# Patient Record
Sex: Female | Born: 1935 | Race: White | Hispanic: No | Smoking: Former smoker
Health system: Southern US, Community
[De-identification: ages and names within clinical notes are randomized; demographics above are authoritative.]

## PROBLEM LIST (undated history)

## (undated) DIAGNOSIS — D62 Acute posthemorrhagic anemia: Secondary | ICD-10-CM

## (undated) DIAGNOSIS — R0600 Dyspnea, unspecified: Secondary | ICD-10-CM

## (undated) DIAGNOSIS — Z923 Personal history of irradiation: Secondary | ICD-10-CM

## (undated) DIAGNOSIS — I5022 Chronic systolic (congestive) heart failure: Secondary | ICD-10-CM

## (undated) DIAGNOSIS — E7800 Pure hypercholesterolemia, unspecified: Secondary | ICD-10-CM

## (undated) DIAGNOSIS — R634 Abnormal weight loss: Secondary | ICD-10-CM

## (undated) DIAGNOSIS — M810 Age-related osteoporosis without current pathological fracture: Secondary | ICD-10-CM

## (undated) DIAGNOSIS — F419 Anxiety disorder, unspecified: Secondary | ICD-10-CM

## (undated) DIAGNOSIS — J449 Chronic obstructive pulmonary disease, unspecified: Secondary | ICD-10-CM

## (undated) DIAGNOSIS — I10 Essential (primary) hypertension: Secondary | ICD-10-CM

## (undated) DIAGNOSIS — K2970 Gastritis, unspecified, without bleeding: Secondary | ICD-10-CM

## (undated) DIAGNOSIS — E538 Deficiency of other specified B group vitamins: Secondary | ICD-10-CM

## (undated) DIAGNOSIS — C3490 Malignant neoplasm of unspecified part of unspecified bronchus or lung: Secondary | ICD-10-CM

## (undated) DIAGNOSIS — K429 Umbilical hernia without obstruction or gangrene: Secondary | ICD-10-CM

## (undated) DIAGNOSIS — I429 Cardiomyopathy, unspecified: Secondary | ICD-10-CM

## (undated) DIAGNOSIS — K254 Chronic or unspecified gastric ulcer with hemorrhage: Secondary | ICD-10-CM

## (undated) DIAGNOSIS — C801 Malignant (primary) neoplasm, unspecified: Secondary | ICD-10-CM

## (undated) HISTORY — DX: Anxiety disorder, unspecified: F41.9

## (undated) HISTORY — DX: Age-related osteoporosis without current pathological fracture: M81.0

## (undated) HISTORY — DX: Chronic or unspecified gastric ulcer with hemorrhage: K25.4

## (undated) HISTORY — DX: Personal history of irradiation: Z92.3

## (undated) HISTORY — DX: Acute posthemorrhagic anemia: D62

## (undated) HISTORY — DX: Gastritis, unspecified, without bleeding: K29.70

## (undated) HISTORY — DX: Pure hypercholesterolemia, unspecified: E78.00

## (undated) HISTORY — DX: Abnormal weight loss: R63.4

## (undated) HISTORY — PX: TUBAL LIGATION: SHX77

## (undated) HISTORY — DX: Deficiency of other specified B group vitamins: E53.8

## (undated) HISTORY — DX: Chronic obstructive pulmonary disease, unspecified: J44.9

## (undated) HISTORY — DX: Umbilical hernia without obstruction or gangrene: K42.9

## (undated) HISTORY — DX: Essential (primary) hypertension: I10

---

## 2011-08-26 ENCOUNTER — Encounter: Payer: Self-pay | Admitting: Internal Medicine

## 2011-08-27 ENCOUNTER — Ambulatory Visit (INDEPENDENT_AMBULATORY_CARE_PROVIDER_SITE_OTHER): Payer: Medicare HMO | Admitting: Internal Medicine

## 2011-08-27 ENCOUNTER — Ambulatory Visit (INDEPENDENT_AMBULATORY_CARE_PROVIDER_SITE_OTHER)
Admission: RE | Admit: 2011-08-27 | Discharge: 2011-08-27 | Disposition: A | Discharge status: Home / Self Care | Payer: Medicare HMO | Source: Ambulatory Visit | Attending: Internal Medicine | Admitting: Internal Medicine

## 2011-08-27 ENCOUNTER — Other Ambulatory Visit (INDEPENDENT_AMBULATORY_CARE_PROVIDER_SITE_OTHER): Payer: Medicare HMO

## 2011-08-27 ENCOUNTER — Encounter: Payer: Self-pay | Admitting: Internal Medicine

## 2011-08-27 VITALS — BP 148/82 | HR 84 | Temp 98.6°F | Ht <= 58 in | Wt 102.4 lb

## 2011-08-27 DIAGNOSIS — J449 Chronic obstructive pulmonary disease, unspecified: Secondary | ICD-10-CM

## 2011-08-27 DIAGNOSIS — R0600 Dyspnea, unspecified: Secondary | ICD-10-CM

## 2011-08-27 DIAGNOSIS — R0989 Other specified symptoms and signs involving the circulatory and respiratory systems: Secondary | ICD-10-CM

## 2011-08-27 DIAGNOSIS — I10 Essential (primary) hypertension: Secondary | ICD-10-CM

## 2011-08-27 DIAGNOSIS — F17200 Nicotine dependence, unspecified, uncomplicated: Secondary | ICD-10-CM

## 2011-08-27 DIAGNOSIS — R0609 Other forms of dyspnea: Secondary | ICD-10-CM

## 2011-08-27 DIAGNOSIS — D649 Anemia, unspecified: Secondary | ICD-10-CM

## 2011-08-27 LAB — BASIC METABOLIC PANEL
CO2: 27 mEq/L (ref 19–32)
Chloride: 102 mEq/L (ref 96–112)
Creatinine, Ser: 0.9 mg/dL (ref 0.4–1.2)
Potassium: 4.6 mEq/L (ref 3.5–5.1)

## 2011-08-27 LAB — CBC WITH DIFFERENTIAL/PLATELET
Basophils Relative: 0.6 % (ref 0.0–3.0)
Eosinophils Relative: 0.2 % (ref 0.0–5.0)
HCT: 27 % — ABNORMAL LOW (ref 36.0–46.0)
Hemoglobin: 8.6 g/dL — ABNORMAL LOW (ref 12.0–15.0)
Lymphs Abs: 2.3 10*3/uL (ref 0.7–4.0)
MCV: 81.9 fl (ref 78.0–100.0)
Monocytes Absolute: 0.6 10*3/uL (ref 0.1–1.0)
Monocytes Relative: 8.1 % (ref 3.0–12.0)
Neutro Abs: 4.9 10*3/uL (ref 1.4–7.7)
Platelets: 406 10*3/uL — ABNORMAL HIGH (ref 150.0–400.0)
WBC: 7.9 10*3/uL (ref 4.5–10.5)

## 2011-08-27 LAB — TSH: TSH: 0.96 u[IU]/mL (ref 0.35–5.50)

## 2011-08-27 MED ORDER — VALSARTAN 80 MG PO TABS: 80.0000 mg | ORAL_TABLET | Freq: Every day | ORAL | Status: DC

## 2011-08-27 NOTE — Patient Instructions (Addendum)
Stop prinivil zestoril lisinopril  Start valsartan 80 mg one daily   Stop spiriva and daliresp  Just use the duoneb (nebulizer) up to 4 times a day if needed  The key is to stop smoking completely before smoking completely stops you!   Please schedule a follow up office visit in 4 weeks, sooner if needed

## 2011-08-27 NOTE — Assessment & Plan Note (Signed)

## 2011-08-27 NOTE — Assessment & Plan Note (Signed)
I reviewed the Flethcher curve with patient that basically indicates  if you quit smoking when your best day FEV1 is still well preserved( which hers clearly as she can still go shopping on her good days) it is highly unlikely you will progress to severe disease and informed the patient there was no medication on the market that has proven to change the curve or the likelihood of progression.  Therefore stopping smoking and maintaining abstinence is the most important aspect of care, not choice of inhalers or for that matter, doctors.    For now rec duoneb prn, off acei and return for pfts

## 2011-08-27 NOTE — Assessment & Plan Note (Signed)

## 2011-08-27 NOTE — Assessment & Plan Note (Signed)
  Lab 08/27/11 1231  HGB 8.6 Repeated and verified X2.*    Normocytic with recent gi w/u neg in [place], should continue Fe for now and f/u with primary

## 2011-08-27 NOTE — Progress Notes (Signed)
  Subjective:    Patient ID: Alicia Davidson, female    DOB: 1930-10-19   MRN: [id number]  HPI  69 yowf active smoker referred 08/27/2011 to pulmonary clinic by Dr Arlyn Leak with episodic doe/cough x around 2000.   08/27/2011 1st pulmonary/ov Indalecio Malmstrom baseline  Feb 2013 not typically limited by breathing or needing daily resp rx, hosp in March in [place] for breathing problems then April GIB no source but since then doe x severe at rest at times and other times can still doing does shopping more fatigue than sob but better with inhalers which she has changed from every few days to daily multiple agents while on ACEI.   No unusual cough, purulent sputum or sinus/hb symptoms on present rx.     Sleeping ok without nocturnal  or early am exacerbation  of respiratory  c/o's or need for noct saba. Also denies any obvious fluctuation of symptoms with weather or environmental changes or other aggravating or alleviating factors except as outlined above     Review of Systems  Constitutional: Positive for unexpected weight change. Negative for fever and chills.  HENT: Negative for ear pain, nosebleeds, congestion, sore throat, rhinorrhea, sneezing, trouble swallowing, dental problem, voice change, postnasal drip and sinus pressure.   Eyes: Negative for visual disturbance.  Respiratory: Positive for cough and shortness of breath. Negative for choking.   Cardiovascular: Negative for chest pain and leg swelling.  Gastrointestinal: Negative for vomiting, abdominal pain and diarrhea.  Genitourinary: Negative for difficulty urinating.  Musculoskeletal: Negative for arthralgias.  Skin: Negative for rash.  Neurological: Negative for tremors, syncope and headaches.  Hematological: Does not bruise/bleed easily.       Objective:   Physical Exam Wt Readings from Last 3 Encounters:  08/27/11 102 lb 6.4 oz (46.448 kg)   amb frail wf >  Stated age HEENT mild turbinate edema.  Oropharynx no thrush or excess pnd or  cobblestoning.  No JVD or cervical adenopathy. Mild accessory muscle hypertrophy. Trachea midline, nl thryroid. Chest was hyperinflated by percussion with diminished breath sounds and moderate increased exp time without wheeze. Hoover sign positive at mid inspiration. Regular rate and rhythm without murmur gallop or rub or increase P2 or edema.  Abd: no hsm, nl excursion. Ext warm without cyanosis or clubbing.    CXR  08/27/2011 :  Chronic interstitial markings/emphysematous changes.  Mild patchy opacity at the right lung base, favored to reflect atelectasis        Assessment & Plan:

## 2011-08-28 NOTE — Progress Notes (Signed)
Quick Note:  Spoke with pt and notified of results per Dr. Wert. Pt verbalized understanding and denied any questions.  ______ 

## 2011-09-26 ENCOUNTER — Ambulatory Visit: Payer: Medicare HMO | Admitting: Internal Medicine

## 2011-10-09 ENCOUNTER — Encounter: Payer: Medicare HMO | Admitting: Internal Medicine

## 2011-10-09 NOTE — Progress Notes (Signed)
 This encounter was created in error - please disregard.

## 2011-10-14 ENCOUNTER — Encounter: Payer: Self-pay | Admitting: Internal Medicine

## 2011-10-14 ENCOUNTER — Ambulatory Visit (INDEPENDENT_AMBULATORY_CARE_PROVIDER_SITE_OTHER): Payer: Medicare HMO | Admitting: Internal Medicine

## 2011-10-14 VITALS — BP 140/60 | HR 76 | Temp 98.3°F | Ht 59.0 in | Wt 112.6 lb

## 2011-10-14 DIAGNOSIS — J449 Chronic obstructive pulmonary disease, unspecified: Secondary | ICD-10-CM

## 2011-10-14 DIAGNOSIS — F17200 Nicotine dependence, unspecified, uncomplicated: Secondary | ICD-10-CM

## 2011-10-14 DIAGNOSIS — I10 Essential (primary) hypertension: Secondary | ICD-10-CM

## 2011-10-14 NOTE — Assessment & Plan Note (Signed)
I reviewed the Flethcher curve with patient that basically indicates  if you quit smoking when your best day FEV1 is still well preserved (which hers is) it is highly unlikely you will progress to severe disease and informed the patient there was no medication on the market that has proven to change the curve or the likelihood of progression.  Therefore stopping smoking and maintaining abstinence is the most important aspect of care, not choice of inhalers or for that matter, doctors.   Pulmonary f/u can be prn

## 2011-10-14 NOTE — Assessment & Plan Note (Signed)
-   Trial off acei rec 08/27/2011 > symptoms resolved   - Spirometry 10/14/2011 FEV1  1.06 (69%) ratio 65  She does have GOLD II COPD but no symptoms or need for any form of inhalers/ nebs, at least in absence of exacerbation.  Therefor f/u can be prn

## 2011-10-14 NOTE — Progress Notes (Signed)
  Subjective:    Patient ID: Maryalice Pasley, female    DOB: April 27, 1930   MRN: [id number]  HPI  89 yowf active smoker referred 08/27/2011 to pulmonary clinic by Dr Arlyn Leak with episodic doe/cough x around 2000.   08/27/2011 1st pulmonary/ov Tashina Credit baseline  Feb 2013 not typically limited by breathing or needing daily resp rx, hosp in March in [place] for breathing problems then April GIB no source but since then doe x severe at rest at times and other times can still doing does shopping more fatigue than sob but better with inhalers which she has changed from every few days to daily multiple agents while on ACEI. rec Stop prinivil zestoril lisinopril Start valsartan 80 mg one daily  Stop spiriva and daliresp Just use the duoneb (nebulizer) up to 4 times a day if needed The key is to stop smoking completely before smoking completely stops you!      10/09/2011 f/u ov/Nicie Milan cc  Missed ov  10/14/2011 f/u ov/Coleby Yett cc no limiting sob, no need for hfa or neb at all, no cough, still smoking    Sleeping ok without nocturnal  or early am exacerbation  of respiratory  c/o's or need for noct saba. Also denies any obvious fluctuation of symptoms with weather or environmental changes or other aggravating or alleviating factors except as outlined above           Objective:   Physical Exam Wt 10/14/2011  112 Wt Readings from Last 3 Encounters:  08/27/11 102 lb 6.4 oz (46.448 kg)   amb frail wf >  Stated age HEENT mild turbinate edema.  Oropharynx no thrush or excess pnd or cobblestoning.  No JVD or cervical adenopathy. Mild accessory muscle hypertrophy. Trachea midline, nl thryroid. Chest was hyperinflated by percussion with diminished breath sounds and moderate increased exp time without wheeze. Hoover sign positive at mid inspiration. Regular rate and rhythm without murmur gallop or rub or increase P2 or edema.  Abd: no hsm, nl excursion. Ext warm without cyanosis or clubbing.             Assessment  & Plan:

## 2011-10-14 NOTE — Patient Instructions (Addendum)
You do not have significant copd but may be prone to attacks of breathing difficulty and cough as long as you keep smoking  You should avoid ace inhibitors as a class  Because they make you look worse than you really are   If you are satisfied with your treatment plan let your doctor know and he/she can either refill your medications or you can return here when your prescription runs out.     If in any way you are not 100% satisfied,  please tell us.  If 100% better, tell your friends!

## 2011-10-14 NOTE — Assessment & Plan Note (Signed)
-   Changed acei to arb 08/27/2011 due to ? Pseudoasthma > resolved  Adequate control on present rx, reviewed need to avoid acei to avoid confusion regarding interpretation of symptoms

## 2011-10-15 NOTE — Progress Notes (Signed)
Quick Note:  Spoke with pt and notified of results per Dr. Wert. Pt verbalized understanding and denied any questions.  ______ 

## 2011-12-10 ENCOUNTER — Ambulatory Visit: Payer: Medicare HMO | Admitting: Internal Medicine

## 2012-04-23 ENCOUNTER — Encounter: Payer: Self-pay | Admitting: Internal Medicine

## 2012-04-23 ENCOUNTER — Ambulatory Visit (INDEPENDENT_AMBULATORY_CARE_PROVIDER_SITE_OTHER): Payer: Medicare HMO | Admitting: Internal Medicine

## 2012-04-23 VITALS — BP 118/70 | HR 76 | Temp 97.3°F | Ht 59.0 in | Wt 131.0 lb

## 2012-04-23 DIAGNOSIS — J449 Chronic obstructive pulmonary disease, unspecified: Secondary | ICD-10-CM

## 2012-04-23 NOTE — Progress Notes (Signed)
Subjective:    Patient ID: Alicia Davidson, female    DOB: 10-Sep-1930   MRN: [id number]    Brief patient profile:  26 yowf  Quit smoking 01/07/12 referred 08/27/2011 to pulmonary clinic by Dr Arlyn Leak with episodic doe/cough x around 2000.  HPI 08/27/2011 1st pulmonary/ov Javeon Macmurray baseline  Feb 2013 not typically limited by breathing or needing daily resp rx, hosp in March in [place] for breathing problems then April GIB no source but since then doe x severe at rest at times and other times can still doing does shopping more fatigue than sob but better with inhalers which she has changed from every few days to daily multiple agents while on ACEI. rec Stop prinivil zestoril lisinopril Start valsartan 80 mg one daily  Stop spiriva and daliresp Just use the duoneb (nebulizer) up to 4 times a day if needed The key is to stop smoking completely before smoking completely stops you!      10/09/2011 f/u ov/Oumou Smead cc  Missed ov  10/14/2011 f/u ov/Wilson Dusenbery cc no limiting sob, no need for hfa or neb at all, no cough, still smoking rec You do not have significant copd but may be prone to attacks of breathing difficulty and cough as long as you keep smoking You should avoid ace inhibitors as a class  Because they make you look worse than you really are   04/23/2012 f/u ov/Annabeth Tortora off cigs as of 01/07/12 and overall much better since Chief Complaint  Patient presents with  . Follow-up    Pt was started on o2 about a month ago after hospital admit with CHF. She states has good and bad days with her breathing.    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 No unusual exp hx or h/o childhood pna/ asthma or premature birth to her knowledge.      Sleeping ok  without nocturnal  or early am exacerbation  of respiratory  c/o's or need for noct saba. Also denies any obvious fluctuation of symptoms with weather or environmental changes or other aggravating or alleviating factors except as outlined above   Current Medications, Allergies, Past Medical History, Past Surgical History, Family History, and Social History were reviewed in [employer] record.  ROS  The following are not active complaints unless bolded sore throat, dysphagia, dental problems, itching, sneezing,  nasal congestion or excess/ purulent secretions, ear ache,   fever, chills, sweats, unintended wt loss, pleuritic or exertional cp, hemoptysis,  orthopnea pnd or leg swelling, presyncope, palpitations, heartburn, abdominal pain, anorexia, nausea, vomiting, diarrhea  or change in bowel or urinary habits, change in stools or urine, dysuria,hematuria,  rash, arthralgias, visual complaints, headache, numbness weakness or ataxia or problems with walking or coordination,  change in mood/affect or memory.              Objective:   Physical Exam  Wt 10/14/2011  112  Wt Readings from Last 3 Encounters:  08/27/11 102 lb 6.4 oz (46.448 kg)   amb frail wf >  Stated age HEENT mild turbinate edema.  Oropharynx no thrush or excess pnd or cobblestoning.  No JVD or cervical adenopathy. Mild accessory muscle hypertrophy. Trachea midline, nl thryroid. Chest was hyperinflated by  percussion with diminished breath sounds and moderate increased exp time without wheeze. Hoover sign positive at mid inspiration. Regular rate and rhythm without murmur gallop or rub or increase P2 or edema.  Abd: no hsm, nl excursion. Ext warm without cyanosis or clubbing.                Assessment & Plan:

## 2012-04-23 NOTE — Patient Instructions (Addendum)
If your breathing gets worse first try xopenex and if not effective then nebulizer and call if you feel you need it because  Ideally you shouldn't need it more than a few times a week.  For now it would be best to stay on the oxygen while sleeping -  We can stop it if you want but would need overngiht 02 sats off oxygen - call Almyra Free [phone] after you see Cardiology

## 2012-04-25 NOTE — Assessment & Plan Note (Signed)
-   Trial off acei rec 08/27/2011 > symptoms resolved   - Spirometry 10/14/2011 FEV1  1.06 (69%) ratio 65  I had an extended summary  discussion with the patient today lasting 15 to 20 minutes of a 25 minute visit on the following issues:   Not clear she needs 02 or nebs either at this point. I reviewed the Flethcher curve with patient that basically indicates  if you quit smoking when your best day FEV1 is still well preserved (as hers is) it is highly unlikely you will progress to severe disease and informed the patient there was no medication on the market that has proven to change the curve or the likelihood of progression.  Therefore stopping smoking and maintaining abstinence is the most important aspect of care, not choice of inhalers or for that matter, doctors.      Each maintenance medication was reviewed in detail including most importantly the difference between maintenance and as needed and under what circumstances the prns are to be used.  Please see instructions for details which were reviewed in writing and the patient given a copy.    Pulmonary f/u can be prn

## 2012-05-10 ENCOUNTER — Encounter: Payer: Self-pay | Admitting: Cardiology

## 2012-05-11 ENCOUNTER — Encounter: Payer: Self-pay | Admitting: Cardiology

## 2012-05-11 ENCOUNTER — Ambulatory Visit (INDEPENDENT_AMBULATORY_CARE_PROVIDER_SITE_OTHER): Payer: Medicare HMO | Admitting: Cardiology

## 2012-05-11 VITALS — BP 138/73 | HR 74 | Ht 59.0 in | Wt 134.5 lb

## 2012-05-11 DIAGNOSIS — I5032 Chronic diastolic (congestive) heart failure: Secondary | ICD-10-CM

## 2012-05-11 DIAGNOSIS — I340 Nonrheumatic mitral (valve) insufficiency: Secondary | ICD-10-CM | POA: Insufficient documentation

## 2012-05-11 DIAGNOSIS — I10 Essential (primary) hypertension: Secondary | ICD-10-CM

## 2012-05-11 DIAGNOSIS — I5042 Chronic combined systolic (congestive) and diastolic (congestive) heart failure: Secondary | ICD-10-CM | POA: Insufficient documentation

## 2012-05-11 DIAGNOSIS — J449 Chronic obstructive pulmonary disease, unspecified: Secondary | ICD-10-CM

## 2012-05-11 DIAGNOSIS — I059 Rheumatic mitral valve disease, unspecified: Secondary | ICD-10-CM

## 2012-05-11 MED ORDER — FUROSEMIDE 20 MG PO TABS: 40.0000 mg | ORAL_TABLET | Freq: Every day | ORAL | Status: DC

## 2012-05-11 NOTE — Assessment & Plan Note (Signed)
Reviewed home blood pressure checks, systolics generally 120s to 140s most of the time. She is no longer on Cardizem which was stopped during her hospitalization in [place] back in March. May need to resume this versus potentially a cardioselective beta blocker if she does have LV systolic dysfunction as well.

## 2012-05-11 NOTE — Progress Notes (Signed)
Clinical Summary Alicia Davidson is a 79 y.o.female referred for cardiology consultation by [name] with [facility] of [place].  Record review finds hospitalization in [place] back in March of this year with shortness of breath, diagnosed with diastolic heart failure exacerbation. She was concurrently treated with steroids with history of COPD, taken off diltiazem related to low blood pressure, also diuresed with improvement. I do not see that an echocardiogram was performed at that time. There is mention in the H and P of an echocardiogram from April 2013 demonstrating LVEF 45-50% with diastolic dysfunction and increased LV filling pressure, moderate mitral regurgitation. I do not have this report.  Lab work from March revealed BUN 30, creatinine 1.0, potassium 4.3, sodium 139, BNP 601, hemoglobin 10.4, platelets 318, TSH 0.9, BNP 1900, LDL 75, HDL 76, cholesterol 162, triglycerides 53.  She is here with her daughter today. They brought in home blood pressure and heart rate checks as well as weights to review. [name] has been on Lasix at 40 mg in the morning since last seeing her primary care provider related to increased weight of approximately 5 pounds over baseline. She is not quite back to 130 pounds yet which is presumably her optimal weight. She does not report any chest pain, no palpitations, no recent leg or ankle edema. We discussed sodium and fluid restrictions. ECG today shows sinus rhythm with NSST changes.  We discussed the available records, recent lab work, general concept of diastolic heart failure, also mentioned her heart murmur on exam and reported mitral regurgitation by her previous echocardiogram. I am not entirely certain why she is on oxygen at this time, states that she was told to stay on it at night by [name]. I do not know if she has documented oxygen desaturation by oximetry at night. Oxygen therapy would not necessarily be required for treatment of her cardiac  status however.  No Known Allergies  Current Outpatient Prescriptions  Medication Sig Dispense Refill  . amoxicillin (AMOXIL) 875 MG tablet       . Ascorbic Acid (VITA-C PO) Take 1 tablet by mouth 2 (two) times daily.      Marland Kitchen atorvastatin (LIPITOR) 20 MG tablet Take 40 mg by mouth daily.       Marland Kitchen FOLIC ACID PO Take 100 mcg by mouth daily.      . furosemide (LASIX) 20 MG tablet Take 2 tablets (40 mg total) by mouth daily.  45 tablet  6  . ipratropium-albuterol (DUONEB) 0.5-2.5 (3) MG/3ML SOLN Take 3 mLs by nebulization every 6 (six) hours as needed.      . Iron TABS Take 1 tablet by mouth 2 (two) times daily.      Marland Kitchen levalbuterol (XOPENEX HFA) 45 MCG/ACT inhaler Inhale 2 puffs into the lungs every 6 (six) hours as needed.      Marland Kitchen LORazepam (ATIVAN) 0.5 MG tablet Take 0.5 mg by mouth daily as needed.      . Multiple Vitamins-Minerals (CENTRUM SILVER) tablet Take 1 tablet by mouth daily.      Marland Kitchen ofloxacin (FLOXIN) 0.3 % otic solution       . omeprazole (PRILOSEC) 40 MG capsule Take 40 mg by mouth daily.      . potassium chloride SA (K-DUR,KLOR-CON) 20 MEQ tablet Take 20 mEq by mouth daily.      . valsartan (DIOVAN) 160 MG tablet Take 160 mg by mouth daily.       No current facility-administered medications for this  visit.    Past Medical History  Diagnosis Date  . COPD (chronic obstructive pulmonary disease)   . Essential hypertension, benign   . Hypercholesteremia   . Osteoporosis   . B12 deficiency   . Anemia associated with acute blood loss   . Gastric ulcer with hemorrhage but without obstruction   . Weight loss, non-intentional   . Anxiety   . Umbilical hernia   . Gastritis     Mild  . Chronic diastolic heart failure     Diagnosed [place] March 2014    No past surgical history on file.  Family History  Problem Relation Age of Onset  . Heart disease Father   . Colon cancer Sister   . Kidney failure Brother   . Other Mother     Bright's disease    Social History Ms.  Halleck reports that she quit smoking about 4 months ago. Her smoking use included Cigarettes. She has a 16.8 pack-year smoking history. She has never used smokeless tobacco. Ms. Blow reports that she does not drink alcohol.  Review of Systems No cough, no recent wheezing. No hospitalizations since March. No orthopnea or PND. Otherwise negative.  Physical Examination Filed Vitals:   05/11/12 0838  BP: 138/73  Pulse: 74   Filed Weights   05/11/12 0838  Weight: 134 lb 8 oz (61.009 kg)   Elderly woman in no acute distress. HEENT: Conjunctiva and lids normal, oropharynx clear. Neck: Supple, no elevated JVP, no thyromegaly. Lungs: Diminished but clear to auscultation, nonlabored breathing at rest. Cardiac: Distant regular rate and rhythm, no S3, 2/6 apical systolic murmur, no pericardial rub. Abdomen: Soft, nontender, bowel sounds present, no guarding or rebound. Extremities: No pitting edema, distal pulses 1-2+. Skin: Warm and dry. Musculoskeletal: Kyphosis noted. Neuropsychiatric: Alert and oriented x3, affect grossly appropriate.   Problem List and Plan   Chronic diastolic heart failure Presumptive diagnosis based on available information. May be more accurately defined as combined heart failure, followup LVEF pending. She is not quite at her baseline weight, will increase Lasix to 60 mg in the morning for the next few days to a week with continued potassium supplements, follow weights until closer to 130 pounds. Then return Lasix to 40 mg in the morning. Follow BMET for clinical visit over the next few weeks. An echocardiogram is being arranged to define cardiac structure and function. We can proceed from there in terms of further medication adjustments.  Mitral regurgitation Murmur consistent with this on exam. Mention of echocardiogram from April of last year indicated moderate mitral regurgitation, report not available this time. Followup echocardiogram being obtained as  noted.  COPD GOLD II Followed by [name]. Oxygen therapy not clearly indicated for her cardiac status, may be more necessary from a pulmonary perspective if she desaturates, although not certain that she has had an oximetry evaluation as yet.  Essential hypertension, benign Reviewed home blood pressure checks, systolics generally 120s to 140s most of the time. She is no longer on Cardizem which was stopped during her hospitalization in [place] back in March. May need to resume this versus potentially a cardioselective beta blocker if she does have LV systolic dysfunction as well.    Jonelle Sidle, M.D., F.A.C.C.

## 2012-05-11 NOTE — Patient Instructions (Addendum)
Your physician recommends that you schedule a follow-up appointment in: 2-3 weeks  Your physician has recommended you make the following change in your medication:   1) INCREASE LASIX 20MG  TO THREE TIMES DAILY UNTIL WEIGHT REACHES BASELINE OF 130LBS ON HOME SCALES, RETURN TO TWO 20MG  TABLETS EVERY AM ONCE BASELINE WEIGHT IS REACHED  Your physician has requested that you have an echocardiogram. Echocardiography is a painless test that uses sound waves to create images of your heart. It provides your doctor with information about the size and shape of your heart and how well your heart's chambers and valves are working. This procedure takes approximately one hour. There are no restrictions for this procedure.  Your physician recommends that you return for lab work in: 2 WEEKS (BMET) SLIPS GIVEN

## 2012-05-11 NOTE — Assessment & Plan Note (Signed)
Presumptive diagnosis based on available information. May be more accurately defined as combined heart failure, followup LVEF pending. She is not quite at her baseline weight, will increase Lasix to 60 mg in the morning for the next few days to a week with continued potassium supplements, follow weights until closer to 130 pounds. Then return Lasix to 40 mg in the morning. Follow BMET for clinical visit over the next few weeks. An echocardiogram is being arranged to define cardiac structure and function. We can proceed from there in terms of further medication adjustments.

## 2012-05-11 NOTE — Assessment & Plan Note (Signed)
Followed by [name]. Oxygen therapy not clearly indicated for her cardiac status, may be more necessary from a pulmonary perspective if she desaturates, although not certain that she has had an oximetry evaluation as yet.

## 2012-05-11 NOTE — Assessment & Plan Note (Signed)
Murmur consistent with this on exam. Mention of echocardiogram from April of last year indicated moderate mitral regurgitation, report not available this time. Followup echocardiogram being obtained as noted.

## 2012-05-12 ENCOUNTER — Encounter: Payer: Self-pay | Admitting: Cardiology

## 2012-05-13 ENCOUNTER — Inpatient Hospital Stay (HOSPITAL_COMMUNITY): Admission: RE | Admit: 2012-05-13 | Payer: Medicare HMO | Source: Ambulatory Visit

## 2012-05-18 ENCOUNTER — Telehealth: Payer: Self-pay | Admitting: Cardiology

## 2012-05-18 NOTE — Telephone Encounter (Signed)
Noted  

## 2012-05-18 NOTE — Telephone Encounter (Signed)
FYI: tried to reach pt with more information times three, unable to do so but still wanted to advise

## 2012-05-18 NOTE — Telephone Encounter (Signed)
Pt lasix was increased and she has not lost any weight, but since starting she has been doing better. Just want to know why the weight is not coming down.

## 2012-05-19 NOTE — Telephone Encounter (Signed)
Spoke to pt and she noted that she has lost a pound over night, down from 133 to 132 and denies any SOB, Chest Pain, any sxs of concern, pt will continue current dosage and call back with any concerns or if her weight loss stops again and increases, pt understood all instructions, KL made aware verbally

## 2012-05-21 ENCOUNTER — Ambulatory Visit (HOSPITAL_COMMUNITY)
Admission: RE | Admit: 2012-05-21 | Discharge: 2012-05-21 | Disposition: A | Discharge status: Home / Self Care | Payer: Medicare HMO | Source: Ambulatory Visit | Attending: Cardiology | Admitting: Cardiology

## 2012-05-21 DIAGNOSIS — I059 Rheumatic mitral valve disease, unspecified: Secondary | ICD-10-CM

## 2012-05-21 DIAGNOSIS — J4489 Other specified chronic obstructive pulmonary disease: Secondary | ICD-10-CM | POA: Insufficient documentation

## 2012-05-21 DIAGNOSIS — I5032 Chronic diastolic (congestive) heart failure: Secondary | ICD-10-CM | POA: Insufficient documentation

## 2012-05-21 DIAGNOSIS — Z6827 Body mass index (BMI) 27.0-27.9, adult: Secondary | ICD-10-CM | POA: Insufficient documentation

## 2012-05-21 DIAGNOSIS — I10 Essential (primary) hypertension: Secondary | ICD-10-CM | POA: Insufficient documentation

## 2012-05-21 DIAGNOSIS — J449 Chronic obstructive pulmonary disease, unspecified: Secondary | ICD-10-CM | POA: Insufficient documentation

## 2012-05-21 DIAGNOSIS — F17200 Nicotine dependence, unspecified, uncomplicated: Secondary | ICD-10-CM | POA: Insufficient documentation

## 2012-05-21 NOTE — Progress Notes (Signed)
*  PRELIMINARY RESULTS* Echocardiogram 2D Echocardiogram has been performed.  [employer] 05/21/2012, 2:40 PM

## 2012-05-22 ENCOUNTER — Encounter: Payer: Self-pay | Admitting: *Deleted

## 2012-05-22 LAB — BASIC METABOLIC PANEL
BUN: 26 mg/dL — ABNORMAL HIGH (ref 6–23)
Glucose, Bld: 87 mg/dL (ref 70–99)
Potassium: 4.1 mEq/L (ref 3.5–5.3)

## 2012-06-05 ENCOUNTER — Ambulatory Visit: Payer: Medicare HMO | Admitting: Cardiology

## 2012-06-05 ENCOUNTER — Ambulatory Visit (INDEPENDENT_AMBULATORY_CARE_PROVIDER_SITE_OTHER): Payer: Medicare HMO | Admitting: Cardiology

## 2012-06-05 ENCOUNTER — Encounter: Payer: Self-pay | Admitting: Cardiology

## 2012-06-05 VITALS — BP 149/77 | HR 72 | Ht 59.0 in | Wt 133.2 lb

## 2012-06-05 DIAGNOSIS — I10 Essential (primary) hypertension: Secondary | ICD-10-CM

## 2012-06-05 DIAGNOSIS — J449 Chronic obstructive pulmonary disease, unspecified: Secondary | ICD-10-CM

## 2012-06-05 DIAGNOSIS — I340 Nonrheumatic mitral (valve) insufficiency: Secondary | ICD-10-CM

## 2012-06-05 DIAGNOSIS — I5042 Chronic combined systolic (congestive) and diastolic (congestive) heart failure: Secondary | ICD-10-CM

## 2012-06-05 DIAGNOSIS — I059 Rheumatic mitral valve disease, unspecified: Secondary | ICD-10-CM

## 2012-06-05 MED ORDER — CARVEDILOL 3.125 MG PO TABS: 3.1250 mg | ORAL_TABLET | Freq: Two times a day (BID) | ORAL | Status: DC

## 2012-06-05 NOTE — Patient Instructions (Addendum)
Your physician recommends that you schedule a follow-up appointment in: WITH SM TO BE DETERMINED BY DECISION OF TEST, CALL OFFICE TODAY [phone]  Your physician has recommended you make the following change in your medication:   1) START ASPIRIN 81MG  ONCE DAILY 2) START COREG 3.125MG  TWICE DAILY

## 2012-06-05 NOTE — Assessment & Plan Note (Signed)
Moderate by recent echocardiogram was moderately dilated left atrium.

## 2012-06-05 NOTE — Assessment & Plan Note (Signed)
Medications be adjusted as above.

## 2012-06-05 NOTE — Progress Notes (Signed)
Clinical Summary Alicia Davidson is a 79 y.o.female seen in the office for the first time in early May. She is here with her son-in-law to go over recent cardiac evaluation. Symptomatically, she reports continued episodes of intermittent breathlessness, not always with exertion. No definite chest tightness. She states her weight has been stable.  Recent followup echocardiogram on 5/15 demonstrated mildly to moderately dilated left ventricle with LVEF 30-35%, diffuse hypokinesis, calcified aortic annulus was mildly reduced cusp excursion, moderate mitral regurgitation, moderately dilated left atrium, mildly to moderately dilated right ventricle with mild right atrial enlargement, bowing of the atrial septum from left to right, mild to moderate tricuspid regurgitation, PASP 57 mm mercury. Review the results with him today.  Patient has evidence of cardiomyopathy, not entirely clear that this is ischemic with global hypokinesis, although underlying CAD remains a possibility. Duration of systolic dysfunction is not clear, as no structural assessment was made during her most recent evaluation in [place]. She has hypertension and hyperlipidemia, increasing her risk for ischemic heart disease.  We discussed test findings and implications, possible avenues for evaluating ischemic heart disease further including noninvasive and invasive techniques. I recommended that the patient, her daughter, and son-in-law discussed these options, and they plan to call back this afternoon. We also reviewed medication adjustments.  No Known Allergies  Current Outpatient Prescriptions  Medication Sig Dispense Refill  . Ascorbic Acid (VITA-C PO) Take 1 tablet by mouth 2 (two) times daily.      Marland Kitchen atorvastatin (LIPITOR) 20 MG tablet Take 40 mg by mouth daily.       Marland Kitchen FOLIC ACID PO Take 100 mcg by mouth daily.      . furosemide (LASIX) 20 MG tablet Take 2 tablets (40 mg total) by mouth daily.  45 tablet  6  .  ipratropium-albuterol (DUONEB) 0.5-2.5 (3) MG/3ML SOLN Take 3 mLs by nebulization every 6 (six) hours as needed.      . Iron TABS Take 1 tablet by mouth 2 (two) times daily.      Marland Kitchen levalbuterol (XOPENEX HFA) 45 MCG/ACT inhaler Inhale 2 puffs into the lungs every 6 (six) hours as needed.      Marland Kitchen LORazepam (ATIVAN) 0.5 MG tablet Take 0.5 mg by mouth daily as needed.      . Multiple Vitamins-Minerals (CENTRUM SILVER) tablet Take 1 tablet by mouth daily.      Marland Kitchen omeprazole (PRILOSEC) 40 MG capsule Take 40 mg by mouth daily.      . potassium chloride SA (K-DUR,KLOR-CON) 20 MEQ tablet Take 20 mEq by mouth daily.      . valsartan (DIOVAN) 160 MG tablet Take 160 mg by mouth daily.      . carvedilol (COREG) 3.125 MG tablet Take 1 tablet (3.125 mg total) by mouth 2 (two) times daily.  180 tablet  3   No current facility-administered medications for this visit.    Past Medical History  Diagnosis Date  . COPD (chronic obstructive pulmonary disease)   . Essential hypertension, benign   . Hypercholesteremia   . Osteoporosis   . B12 deficiency   . Anemia associated with acute blood loss   . Gastric ulcer with hemorrhage but without obstruction   . Weight loss, non-intentional   . Anxiety   . Umbilical hernia   . Gastritis     Mild  . Chronic diastolic heart failure     Diagnosed [place] March 2014    Social History Alicia Davidson reports that she quit smoking about  4 months ago. Her smoking use included Cigarettes. She has a 16.8 pack-year smoking history. She has never used smokeless tobacco. Alicia Davidson reports that she does not drink alcohol.  Review of Systems Negative except as outlined above.  Physical Examination Filed Vitals:   06/05/12 0858  BP: 149/77  Pulse: 72   Filed Weights   06/05/12 0858  Weight: 133 lb 4 oz (60.442 kg)    Elderly woman in no acute distress.  HEENT: Conjunctiva and lids normal, oropharynx clear.  Neck: Supple, no elevated JVP, no thyromegaly.  Lungs:  Diminished but clear to auscultation, nonlabored breathing at rest.  Cardiac: Distant regular rate and rhythm, no S3, 2/6 apical systolic murmur, no pericardial rub.  Abdomen: Soft, nontender, bowel sounds present, no guarding or rebound.  Extremities: No pitting edema, distal pulses 1-2+.  Skin: Warm and dry.  Musculoskeletal: Kyphosis noted.  Neuropsychiatric: Alert and oriented x3, affect grossly appropriate.   Problem List and Plan   Chronic combined systolic and diastolic heart failure Recent echocardiogram reviewed with the patient and her son-in-law. LVEF is actually in the range of 30-35% with diffuse hypokinesis, also elevated right-sided pressures and mitral regurgitation. To her current medical regimen we will add aspirin 81 mg daily and begin Coreg 3.125 mg twice daily. I recommended that we consider further ischemic evaluation, discussed rationale as well as risks and benefits of both noninvasive and invasive techniques. Family will discuss the matter and call us back today. If noninvasive testing is decided on as a first step, we will pursue a [employer].  Essential hypertension, benign Medications be adjusted as above.  Mitral regurgitation Moderate by recent echocardiogram was moderately dilated left atrium.  COPD GOLD II Followed by [name].    Jonelle Sidle, M.D., F.A.C.C.

## 2012-06-05 NOTE — Assessment & Plan Note (Signed)
Recent echocardiogram reviewed with the patient and her son-in-law. LVEF is actually in the range of 30-35% with diffuse hypokinesis, also elevated right-sided pressures and mitral regurgitation. To her current medical regimen we will add aspirin 81 mg daily and begin Coreg 3.125 mg twice daily. I recommended that we consider further ischemic evaluation, discussed rationale as well as risks and benefits of both noninvasive and invasive techniques. Family will discuss the matter and call us back today. If noninvasive testing is decided on as a first step, we will pursue a [employer].

## 2012-06-05 NOTE — Assessment & Plan Note (Signed)
Followed by Dr Wert 

## 2012-06-10 ENCOUNTER — Telehealth: Payer: Self-pay | Admitting: Cardiology

## 2012-06-10 NOTE — Telephone Encounter (Signed)
Please call regarding appointment for stress test. / tgs

## 2012-06-12 NOTE — Telephone Encounter (Signed)
.  left message to have patient return my call. Per pt was to decide to have either:  lexiscan (on medications) post test with SM ONLY per cardiomyopathy Or cath, if cath we will call SM to discuss further

## 2012-06-15 NOTE — Telephone Encounter (Signed)
Pt scheduled to see KL tomorrow per SOB

## 2012-06-16 ENCOUNTER — Encounter: Payer: Self-pay | Admitting: *Deleted

## 2012-06-16 ENCOUNTER — Ambulatory Visit (INDEPENDENT_AMBULATORY_CARE_PROVIDER_SITE_OTHER): Payer: Medicare HMO | Admitting: Adult Health

## 2012-06-16 ENCOUNTER — Encounter: Payer: Self-pay | Admitting: Adult Health

## 2012-06-16 VITALS — BP 118/60 | HR 72 | Resp 17 | Wt 133.0 lb

## 2012-06-16 DIAGNOSIS — I10 Essential (primary) hypertension: Secondary | ICD-10-CM

## 2012-06-16 DIAGNOSIS — I059 Rheumatic mitral valve disease, unspecified: Secondary | ICD-10-CM

## 2012-06-16 DIAGNOSIS — I5042 Chronic combined systolic (congestive) and diastolic (congestive) heart failure: Secondary | ICD-10-CM

## 2012-06-16 DIAGNOSIS — I340 Nonrheumatic mitral (valve) insufficiency: Secondary | ICD-10-CM

## 2012-06-16 NOTE — Assessment & Plan Note (Addendum)
Per echocardiogram the patient was found to have moderate mitral regurg, with a moderately dilated left atrium and mildly to markedly dilated right ventricle. Continue heart rate and blood pressure control.

## 2012-06-16 NOTE — Progress Notes (Signed)
HPI: [name] is a 79 year old of Dr. Diona Browner whereupon for ongoing assessment and management of combined congestive heart failure, most recent EF on 05/21/2012 demonstrated 30-35%, diffuse hypokinesis, with calcified aortic annulus and mildly reduced cusp excursion. On last visit with Dr. Diona Browner on 06/05/2012 possibility of ischemic heart disease was discussed with the patient and her son and daughter. It was not entirely clear that it was ischemic in the setting of global hypokinesis although underlying CAD remained a possibility. The patient was started on aspirin 81 mg daily, Coreg 3.125 mg daily. A stress Myoview was recommended. This was left to the decision of the patient and her family to pursue.   She comes today with her daughter and after long discussion has decided that she will pursue a [employer]. She is been seen by Dr. work in the interim and has been advised not to take Coreg in the setting of her COPD. She does state that her breathing status has worsened some but there is no active wheezing. She uses oxygen at night and when necessary during the day. She denies palpitations or near syncope.  No Known Allergies  Current Outpatient Prescriptions  Medication Sig Dispense Refill  . Ascorbic Acid (VITA-C PO) Take 1 tablet by mouth 2 (two) times daily.      Marland Kitchen atorvastatin (LIPITOR) 20 MG tablet Take 40 mg by mouth daily.       . carvedilol (COREG) 3.125 MG tablet Take 1 tablet (3.125 mg total) by mouth 2 (two) times daily.  180 tablet  3  . FOLIC ACID PO Take 100 mcg by mouth daily.      . furosemide (LASIX) 20 MG tablet Take 2 tablets (40 mg total) by mouth daily.  45 tablet  6  . ipratropium-albuterol (DUONEB) 0.5-2.5 (3) MG/3ML SOLN Take 3 mLs by nebulization every 6 (six) hours as needed.      . Iron TABS Take 1 tablet by mouth 2 (two) times daily.      Marland Kitchen levalbuterol (XOPENEX HFA) 45 MCG/ACT inhaler Inhale 2 puffs into the lungs every 6 (six) hours as needed.      Marland Kitchen  LORazepam (ATIVAN) 0.5 MG tablet Take 0.5 mg by mouth daily as needed.      . Multiple Vitamins-Minerals (CENTRUM SILVER) tablet Take 1 tablet by mouth daily.      Marland Kitchen omeprazole (PRILOSEC) 40 MG capsule Take 40 mg by mouth daily.      . potassium chloride SA (K-DUR,KLOR-CON) 20 MEQ tablet Take 20 mEq by mouth daily.      . valsartan (DIOVAN) 160 MG tablet Take 160 mg by mouth daily.       No current facility-administered medications for this visit.    Past Medical History  Diagnosis Date  . COPD (chronic obstructive pulmonary disease)   . Essential hypertension, benign   . Hypercholesteremia   . Osteoporosis   . B12 deficiency   . Anemia associated with acute blood loss   . Gastric ulcer with hemorrhage but without obstruction   . Weight loss, non-intentional   . Anxiety   . Umbilical hernia   . Gastritis     Mild  . Chronic diastolic heart failure     Diagnosed [place] March 2014    No past surgical history on file.  ZOX:WRUEAV of systems complete and found to be negative unless listed above  PHYSICAL EXAM BP 118/60  Pulse 72  Resp 17  Wt 133 lb (60.328 kg)  BMI 26.85  kg/m2  General: Well developed, well nourished, in no acute distress Head: Eyes PERRLA, No xanthomas.   Normal cephalic and atramatic  Lungs: Clear bilaterally to auscultation and percussion. Heart: HRRR S1 S2, distant heart sounds with 1/6 systolic murmur at the apex,pulses are 2+ & equal.            No carotid bruit. No JVD.  No abdominal bruits. No femoral bruits. Abdomen: Bowel sounds are positive, abdomen soft and non-tender without masses or                  Hernia's noted. Msk:  Back kyphosis is noted, slow gait. Normal strength and tone for age. Extremities: Mild clubbing, no cyanosis or edema.  DP +1 Neuro: Alert and oriented X 3. Psych:  Good affect, responds appropriately    ASSESSMENT AND PLAN

## 2012-06-16 NOTE — Assessment & Plan Note (Signed)
Blood pressure is currently well controlled. Will not make any changes at this time. She will remain on Lasix prolactin and valsartan. Followup labs in one month will be obtained to ascertain kidney function.

## 2012-06-16 NOTE — Assessment & Plan Note (Signed)
There is no evidence of decompensation at this time. Her weight has remained stable. She is denying any PND, but she does have some chronic dyspnea on exertion. Most likely related to COPD. Due to echocardiogram demonstrating EF of 3035% with diffuse hypokinesis the family has made the decision to proceed with a Lexiscan Myoview. This will be completed within a week. I explained to them that if Myoview demonstrates ischemia in the next step will be cardiac catheterization. They are willing to proceed with this should be necessary. I will not make any changes in her medication regimen as she is tolerating the Coreg without wheezing. Blood pressure parameters are given to her daughter to keep her blood pressure less than 120 systolic. She is to call for any new symptoms prior to stress test. She is advised to bring inhalers to her stress test.

## 2012-06-16 NOTE — Patient Instructions (Addendum)
Your physician recommends that you schedule a follow-up appointment in: ONE WEEK  Your physician recommends that you continue on your current medications as directed. Please refer to the Current Medication list given to you today.  Your physician has requested that you have a lexiscan myoview. For further information please visit [email or web address] Please follow instruction sheet, as given.

## 2012-06-16 NOTE — Telephone Encounter (Signed)
Pt scheduled for lexiscan per KL at [facility] today

## 2012-06-23 ENCOUNTER — Encounter (HOSPITAL_COMMUNITY)
Admission: RE | Admit: 2012-06-23 | Discharge: 2012-06-23 | Disposition: A | Discharge status: Home / Self Care | Payer: Medicare HMO | Source: Ambulatory Visit | Attending: Adult Health | Admitting: Adult Health

## 2012-06-23 ENCOUNTER — Encounter (HOSPITAL_COMMUNITY): Payer: Self-pay

## 2012-06-23 DIAGNOSIS — I5042 Chronic combined systolic (congestive) and diastolic (congestive) heart failure: Secondary | ICD-10-CM

## 2012-06-23 DIAGNOSIS — I10 Essential (primary) hypertension: Secondary | ICD-10-CM | POA: Insufficient documentation

## 2012-06-23 DIAGNOSIS — I509 Heart failure, unspecified: Secondary | ICD-10-CM

## 2012-06-23 DIAGNOSIS — R0600 Dyspnea, unspecified: Secondary | ICD-10-CM

## 2012-06-23 HISTORY — DX: Malignant (primary) neoplasm, unspecified: C80.1

## 2012-06-23 MED ORDER — SODIUM CHLORIDE 0.9 % IJ SOLN
INTRAMUSCULAR | Status: AC
  Administered 2012-06-23: 10 mL via INTRAVENOUS
  Filled 2012-06-23: qty 10

## 2012-06-23 MED ORDER — REGADENOSON 0.4 MG/5ML IV SOLN
INTRAVENOUS | Status: AC
  Administered 2012-06-23: 0.4 mg via INTRAVENOUS
  Filled 2012-06-23: qty 5

## 2012-06-23 MED ORDER — TECHNETIUM TC 99M SESTAMIBI - CARDIOLITE
30.0000 | Freq: Once | INTRAVENOUS | Status: AC | PRN
  Administered 2012-06-23: 30 via INTRAVENOUS

## 2012-06-23 MED ORDER — TECHNETIUM TC 99M SESTAMIBI - CARDIOLITE
10.0000 | Freq: Once | INTRAVENOUS | Status: AC | PRN
  Administered 2012-06-23: 10:00:00 10 via INTRAVENOUS

## 2012-06-23 NOTE — Progress Notes (Signed)
Stress Lab Nurses Notes - Alicia Davidson 06/23/2012 Reason for doing test: CHF & SOB Type of test: Marlane Hatcher Nurse performing test: Parke Poisson, RN Nuclear Medicine Tech: Lyndel Pleasure Echo Tech: Not Applicable MD performing test: R. Rothbart Family MD: McInnis Test explained and consent signed: yes IV started: 22g jelco, NS 250 cc, No redness or edema and Saline lock started in radiology Symptoms: Mild SOB & Dizziness Treatment/Intervention: 250cc NS given during recovery BP 50/39 & Dizziness Reason test stopped: protocol completed After recovery IV was: Discontinued via X-ray tech and No redness or edema Patient to return to Nuc. Med at : 13:00 Patient discharged: Home Patient's Condition upon discharge was: stable Comments: During test BP 50/39, HR 76  & Dizziness. IV Bolus given 250 cc NS . BP return to 117/77 in recovery & HR 73.  Symptoms resolved in recovery. [name] T

## 2012-06-30 ENCOUNTER — Encounter: Payer: Self-pay | Admitting: Adult Health

## 2012-06-30 ENCOUNTER — Ambulatory Visit (INDEPENDENT_AMBULATORY_CARE_PROVIDER_SITE_OTHER): Payer: Medicare HMO | Admitting: Adult Health

## 2012-06-30 VITALS — BP 134/86 | HR 92 | Ht 59.0 in | Wt 136.1 lb

## 2012-06-30 DIAGNOSIS — J449 Chronic obstructive pulmonary disease, unspecified: Secondary | ICD-10-CM

## 2012-06-30 DIAGNOSIS — I5042 Chronic combined systolic (congestive) and diastolic (congestive) heart failure: Secondary | ICD-10-CM

## 2012-06-30 DIAGNOSIS — J4489 Other specified chronic obstructive pulmonary disease: Secondary | ICD-10-CM

## 2012-06-30 DIAGNOSIS — R0600 Dyspnea, unspecified: Secondary | ICD-10-CM

## 2012-06-30 DIAGNOSIS — R0989 Other specified symptoms and signs involving the circulatory and respiratory systems: Secondary | ICD-10-CM

## 2012-06-30 DIAGNOSIS — I10 Essential (primary) hypertension: Secondary | ICD-10-CM

## 2012-06-30 DIAGNOSIS — J390 Retropharyngeal and parapharyngeal abscess: Secondary | ICD-10-CM

## 2012-06-30 MED ORDER — DIGOXIN 125 MCG PO TABS
0.1250 mg | ORAL_TABLET | Freq: Every day | ORAL | Status: DC
Start: 2012-06-30 — End: 2012-10-20

## 2012-06-30 MED ORDER — CARVEDILOL 6.25 MG PO TABS: 6.2500 mg | ORAL_TABLET | Freq: Two times a day (BID) | ORAL | Status: DC

## 2012-06-30 NOTE — Patient Instructions (Addendum)
Your physician recommends that you schedule a follow-up appointment in: 1 MONTH  Your physician has recommended you make the following change in your medication: 1. Coreg 6.25mg   Twice a day. 2. Digoxin 0.125 mg daily  Your physician recommends that you return for lab work in 1 WEEK (BMET, Dig level)

## 2012-06-30 NOTE — Progress Notes (Signed)
HPI: Alicia Davidson is a 79 year old patient of Dr. Diona Browner were following for ongoing assessment and management of combined congestive heart failure, with most recent echocardiogram demonstrating an EF of 30-35% in May of 2014. He was also found to have a calcified aortic annulus and mildly reduced cusps excursion. She was scheduled for [facility] on last visit.    This  revealed:  Abnormal pharmacologic stress nuclear myocardial study revealing substantial left ventricular enlargement, moderate to severe global left ventricular dysfunction and normal myocardial perfusion. The stress EKG was nondiagnostic due to baseline ST-segment abnormalities.   She comes today without any new symptoms. Continues to have some dyspnea on exertion. She is on oxygen at home when necessary.   No Known Allergies  Current Outpatient Prescriptions  Medication Sig Dispense Refill  . Ascorbic Acid (VITA-C PO) Take 1 tablet by mouth 2 (two) times daily.      Marland Kitchen atorvastatin (LIPITOR) 20 MG tablet Take 40 mg by mouth daily.       . carvedilol (COREG) 6.25 MG tablet Take 1 tablet (6.25 mg total) by mouth 2 (two) times daily.  180 tablet  3  . FOLIC ACID PO Take 100 mcg by mouth daily.      . furosemide (LASIX) 20 MG tablet Take 2 tablets (40 mg total) by mouth daily.  45 tablet  6  . ipratropium-albuterol (DUONEB) 0.5-2.5 (3) MG/3ML SOLN Take 3 mLs by nebulization every 6 (six) hours as needed.      . Iron TABS Take 1 tablet by mouth 2 (two) times daily.      Marland Kitchen levalbuterol (XOPENEX HFA) 45 MCG/ACT inhaler Inhale 2 puffs into the lungs every 6 (six) hours as needed.      Marland Kitchen LORazepam (ATIVAN) 0.5 MG tablet Take 0.5 mg by mouth daily as needed.      . Multiple Vitamins-Minerals (CENTRUM SILVER) tablet Take 1 tablet by mouth daily.      Marland Kitchen omeprazole (PRILOSEC) 40 MG capsule Take 40 mg by mouth daily.      . potassium chloride SA (K-DUR,KLOR-CON) 20 MEQ tablet Take 20 mEq by mouth daily.      . valsartan  (DIOVAN) 160 MG tablet Take 160 mg by mouth daily.      . digoxin (LANOXIN) 0.125 MG tablet Take 1 tablet (0.125 mg total) by mouth daily.  90 tablet  3   No current facility-administered medications for this visit.    Past Medical History  Diagnosis Date  . COPD (chronic obstructive pulmonary disease)   . Essential hypertension, benign   . Hypercholesteremia   . Osteoporosis   . B12 deficiency   . Anemia associated with acute blood loss   . Gastric ulcer with hemorrhage but without obstruction   . Weight loss, non-intentional   . Anxiety   . Umbilical hernia   . Gastritis     Mild  . Chronic diastolic heart failure     Diagnosed [place] March 2014  . Cancer   . CHF (congestive heart failure)     History reviewed. No pertinent past surgical history.  ZOX:WRUEAV sinus rhythm  PHYSICAL EXAM BP 134/86  Pulse 92  Ht 4\' 11"  (1.499 m)  Wt 136 lb 1.9 oz (61.744 kg)  BMI 27.48 kg/m2  SpO2 98%  General: Well developed, well nourished, in no acute distress Head: Eyes PERRLA, No xanthomas.   Normal cephalic and atramatic  Lungs: Clear bilaterally to auscultation with mild wheezes noted in the bases. Heart: HRRR  S1 S2, distant heart sounds without MRG.  Pulses are 2+ & equal.            No carotid bruit. No JVD.  No abdominal bruits. No femoral bruits. Abdomen: Bowel sounds are positive, abdomen soft and non-tender without masses or                  Hernia's noted. Msk:  Back normal, normal gait. Normal strength and tone for age. Extremities: No clubbing, cyanosis or edema.  DP +1 Neuro: Alert and oriented X 3. Psych:  Good affect, responds appropriately    ASSESSMENT AND PLAN

## 2012-06-30 NOTE — Assessment & Plan Note (Signed)
Lexiscan Myoview is reassuring for evidence of ischemia, and therefore cardiac catheterization will not be planned. Due to significant LV systolic dysfunction, I will optimize her medications. I will increase her core 6.25 mg twice a day and begin digoxin 0.125 mg daily. She will continue Lasix and ARB. Reluctant to begin spironolactone at this time. We will start out slow with medication adjustments and additions. She will followup with the BMET next week, and will be seen again in one month. Would like to see her on a monthly basis for a short period of time to evaluate her symptoms and progress. We will repeat echocardiogram in approximately 3 months to evaluate LV function on medication optimization.

## 2012-06-30 NOTE — Assessment & Plan Note (Signed)
Blood pressure is only moderately controlled for this patient with significant systolic dysfunction. With titration up of Coreg will follow closely concerning her blood pressure and heart rate. Heart rate today was in the high 90s. I spent a considerable amount of time talking with the patient and her daughter concerning her cardiomyopathy and treatment regimen ( 30 minutes). We will have close followup with labs.

## 2012-06-30 NOTE — Assessment & Plan Note (Signed)
She will continue to follow with [name] for ongoing assessment and management.

## 2012-06-30 NOTE — Assessment & Plan Note (Signed)
This may be related to cardiomyopathy in conjunction with COPD. She is instructed to use Xopenex half before using nebulizer treatments to avoid increasing heart rate. She is also advised to wear oxygen at night as she has been prescribed to do so, and also when oxygen consumption has declined with an saturation of 88%.

## 2012-07-07 LAB — BASIC METABOLIC PANEL
Chloride: 101 mEq/L (ref 96–112)
Creat: 1.05 mg/dL (ref 0.50–1.10)
Potassium: 4.9 mEq/L (ref 3.5–5.3)

## 2012-07-08 LAB — DIGOXIN LEVEL: Digoxin Level: 1.6 ng/mL (ref 0.8–2.0)

## 2012-07-09 ENCOUNTER — Encounter: Payer: Self-pay | Admitting: *Deleted

## 2012-08-03 ENCOUNTER — Ambulatory Visit (INDEPENDENT_AMBULATORY_CARE_PROVIDER_SITE_OTHER): Payer: Medicare HMO | Admitting: Adult Health

## 2012-08-03 ENCOUNTER — Encounter: Payer: Self-pay | Admitting: Adult Health

## 2012-08-03 VITALS — BP 134/62 | HR 61 | Ht 59.0 in | Wt 138.0 lb

## 2012-08-03 DIAGNOSIS — R0600 Dyspnea, unspecified: Secondary | ICD-10-CM

## 2012-08-03 DIAGNOSIS — I10 Essential (primary) hypertension: Secondary | ICD-10-CM

## 2012-08-03 DIAGNOSIS — R0989 Other specified symptoms and signs involving the circulatory and respiratory systems: Secondary | ICD-10-CM

## 2012-08-03 DIAGNOSIS — I5042 Chronic combined systolic (congestive) and diastolic (congestive) heart failure: Secondary | ICD-10-CM

## 2012-08-03 NOTE — Assessment & Plan Note (Addendum)
She is doing well. She has some positional changes of dizziness. Orthostatics are negative with BP rising with standing 234 systolic. Heart rate remains 57-60.  Will continue current medications without changes. I discussed with her daughter to continue to monitor her her vital signs on a daily basis. She is report any symptoms. I discussed My Chart with her and advised her to send me an e-mail for any questions she may have. We will followup with an echocardiogram in 3 months, and a BMET.

## 2012-08-03 NOTE — Assessment & Plan Note (Signed)
This is related to chronic COPD. There is no wheezes or productive coughing on exam. No changes in her medicines.

## 2012-08-03 NOTE — Assessment & Plan Note (Signed)
Blood pressure is well-controlled. Continue current dose of Coreg and Diovan.

## 2012-08-03 NOTE — Progress Notes (Signed)
HPI: Alicia Davidson is a  79 y/o patient of Dr. Diona Browner we are seeing for close follow up for combined CHF, with EF of 30-35%. On last visit she was started on low dose digoxin 0.125 mg and increased dose of coreg to 6.25 mg BID. She has some complaints of dizziness with position change, but no frank syncope.     Labs were completed on last visit demonstrating Na+ of 139, K+ 4.9, Creatinine 1.05. She is otherwise without complaint. her daughter who is with her, brings a list of her blood pressure recordings. They have been running in the 120s to 130s systolic with heart rates between 80 and 90 beats per minute. He denies any chest pain, shortness of breath, vision changes, or nausea with medication changes.   No Known Allergies  Current Outpatient Prescriptions  Medication Sig Dispense Refill  . Ascorbic Acid (VITA-C PO) Take 1 tablet by mouth 2 (two) times daily.      Marland Kitchen aspirin 81 MG tablet Take 81 mg by mouth daily.      Marland Kitchen atorvastatin (LIPITOR) 20 MG tablet Take 40 mg by mouth daily.       . carvedilol (COREG) 6.25 MG tablet Take 1 tablet (6.25 mg total) by mouth 2 (two) times daily.  180 tablet  3  . digoxin (LANOXIN) 0.125 MG tablet Take 1 tablet (0.125 mg total) by mouth daily.  90 tablet  3  . FOLIC ACID PO Take 100 mcg by mouth daily.      . furosemide (LASIX) 20 MG tablet Take 2 tablets (40 mg total) by mouth daily.  45 tablet  6  . ipratropium-albuterol (DUONEB) 0.5-2.5 (3) MG/3ML SOLN Take 3 mLs by nebulization every 6 (six) hours as needed.      . Iron TABS Take 1 tablet by mouth 2 (two) times daily.      Marland Kitchen levalbuterol (XOPENEX HFA) 45 MCG/ACT inhaler Inhale 2 puffs into the lungs every 6 (six) hours as needed.      Marland Kitchen LORazepam (ATIVAN) 0.5 MG tablet Take 0.5 mg by mouth daily as needed.      . Multiple Vitamins-Minerals (CENTRUM SILVER) tablet Take 1 tablet by mouth daily.      Marland Kitchen omeprazole (PRILOSEC) 40 MG capsule Take 40 mg by mouth daily.      . potassium chloride SA  (K-DUR,KLOR-CON) 20 MEQ tablet Take 20 mEq by mouth daily.      . valsartan (DIOVAN) 160 MG tablet Take 160 mg by mouth daily.       No current facility-administered medications for this visit.    Past Medical History  Diagnosis Date  . COPD (chronic obstructive pulmonary disease)   . Essential hypertension, benign   . Hypercholesteremia   . Osteoporosis   . B12 deficiency   . Anemia associated with acute blood loss   . Gastric ulcer with hemorrhage but without obstruction   . Weight loss, non-intentional   . Anxiety   . Umbilical hernia   . Gastritis     Mild  . Chronic diastolic heart failure     Diagnosed [place] March 2014  . Cancer   . CHF (congestive heart failure)     History reviewed. No pertinent past surgical history.  ROS: Review of systems complete and found to be negative unless listed above  PHYSICAL EXAM BP 86/60  Pulse 63  Ht 4\' 11"  (1.499 m)  Wt 138 lb (62.596 kg)  BMI 27.86 kg/m2  General: Well developed, well  nourished, in no acute distress Head: Eyes PERRLA, No xanthomas.   Normal cephalic and atramatic  Lungs: Clear bilaterally to auscultation and percussion. Heart: HRRR S1 S2, distant with soft systolic murmur..  Pulses are 2+ & equal.            No carotid bruit. No JVD.  No abdominal bruits. No femoral bruits. Abdomen: Bowel sounds are positive, abdomen soft and non-tender without masses or                  Hernia's noted. Msk:  Back normal, normal gait. Normal strength and tone for age. Extremities: No clubbing, cyanosis or edema.  DP +1 Neuro: Alert and oriented X 3. Psych:  Good affect, responds appropriately    ASSESSMENT AND PLAN

## 2012-08-03 NOTE — Patient Instructions (Addendum)
Your physician recommends that you schedule a follow-up appointment in: 3 MONTHS  Your physician has requested that you have an echocardiogram. Echocardiography is a painless test that uses sound waves to create images of your heart. It provides your doctor with information about the size and shape of your heart and how well your heart's chambers and valves are working. This procedure takes approximately one hour. There are no restrictions for this procedure.  Your physician recommends that you return for lab work in: 3 MONTHS BMET

## 2012-08-03 NOTE — Progress Notes (Deleted)
Name: Alicia Davidson    DOB: 1930-11-10  Age: 79 y.o.  MR#: [id number]       PCP:  Louie Boston, MD      Insurance: Payor: [employer] MEDICARE / Plan: [employer] MEDICARE HMO / Product Type: *No Product type* /   CC:   No chief complaint on file.   VS Filed Vitals:   08/03/12 1441  BP: 86/60  Pulse: 63  Height: 4\' 11"  (1.499 m)  Weight: 138 lb (62.596 kg)    Weights Current Weight  08/03/12 138 lb (62.596 kg)  06/30/12 136 lb 1.9 oz (61.744 kg)  06/16/12 133 lb (60.328 kg)    Blood Pressure  BP Readings from Last 3 Encounters:  08/03/12 86/60  06/30/12 134/86  06/16/12 118/60     Admit date:  (Not on file) Last encounter with RMR:  06/30/2012   Allergy Review of patient's allergies indicates no known allergies.  Current Outpatient Prescriptions  Medication Sig Dispense Refill  . Ascorbic Acid (VITA-C PO) Take 1 tablet by mouth 2 (two) times daily.      Marland Kitchen aspirin 81 MG tablet Take 81 mg by mouth daily.      Marland Kitchen atorvastatin (LIPITOR) 20 MG tablet Take 40 mg by mouth daily.       . carvedilol (COREG) 6.25 MG tablet Take 1 tablet (6.25 mg total) by mouth 2 (two) times daily.  180 tablet  3  . digoxin (LANOXIN) 0.125 MG tablet Take 1 tablet (0.125 mg total) by mouth daily.  90 tablet  3  . FOLIC ACID PO Take 100 mcg by mouth daily.      . furosemide (LASIX) 20 MG tablet Take 2 tablets (40 mg total) by mouth daily.  45 tablet  6  . ipratropium-albuterol (DUONEB) 0.5-2.5 (3) MG/3ML SOLN Take 3 mLs by nebulization every 6 (six) hours as needed.      . Iron TABS Take 1 tablet by mouth 2 (two) times daily.      Marland Kitchen levalbuterol (XOPENEX HFA) 45 MCG/ACT inhaler Inhale 2 puffs into the lungs every 6 (six) hours as needed.      Marland Kitchen LORazepam (ATIVAN) 0.5 MG tablet Take 0.5 mg by mouth daily as needed.      . Multiple Vitamins-Minerals (CENTRUM SILVER) tablet Take 1 tablet by mouth daily.      Marland Kitchen omeprazole (PRILOSEC) 40 MG capsule Take 40 mg by mouth daily.      . potassium chloride SA  (K-DUR,KLOR-CON) 20 MEQ tablet Take 20 mEq by mouth daily.      . valsartan (DIOVAN) 160 MG tablet Take 160 mg by mouth daily.       No current facility-administered medications for this visit.    Discontinued Meds:   There are no discontinued medications.  Patient Active Problem List   Diagnosis Date Noted  . Chronic combined systolic and diastolic heart failure 05/11/2012  . Mitral regurgitation 05/11/2012  . Dyspnea 08/27/2011  . COPD GOLD II 08/27/2011  . Smoking 08/27/2011  . Anemia 08/27/2011  . Essential hypertension, benign 08/27/2011    LABS    Component Value Date/Time   NA 139 07/07/2012 1000   NA 137 05/21/2012 0250   NA 137 08/27/2011 1231   K 4.9 07/07/2012 1000   K 4.1 05/21/2012 0250   K 4.6 08/27/2011 1231   CL 101 07/07/2012 1000   CL 99 05/21/2012 0250   CL 102 08/27/2011 1231   CO2 28 07/07/2012 1000   CO2 27 05/21/2012  0250   CO2 27 08/27/2011 1231   GLUCOSE 83 07/07/2012 1000   GLUCOSE 87 05/21/2012 0250   GLUCOSE 94 08/27/2011 1231   BUN 26* 07/07/2012 1000   BUN 26* 05/21/2012 0250   BUN 18 08/27/2011 1231   CREATININE 1.05 07/07/2012 1000   CREATININE 1.11* 05/21/2012 0250   CREATININE 0.9 08/27/2011 1231   CALCIUM 9.4 07/07/2012 1000   CALCIUM 10.0 05/21/2012 0250   CALCIUM 9.8 08/27/2011 1231   CMP     Component Value Date/Time   NA 139 07/07/2012 1000   K 4.9 07/07/2012 1000   CL 101 07/07/2012 1000   CO2 28 07/07/2012 1000   GLUCOSE 83 07/07/2012 1000   BUN 26* 07/07/2012 1000   CREATININE 1.05 07/07/2012 1000   CREATININE 0.9 08/27/2011 1231   CALCIUM 9.4 07/07/2012 1000       Component Value Date/Time   WBC 7.9 08/27/2011 1231   HGB 8.6 Repeated and verified X2.* 08/27/2011 1231   HCT 27.0* 08/27/2011 1231   MCV 81.9 08/27/2011 1231    Lipid Panel  No results found for this basename: chol, trig, hdl, cholhdl, vldl, ldlcalc    ABG No results found for this basename: phart, pco2, pco2art, po2, po2art, hco3, tco2, acidbasedef, o2sat     Lab Results  Component  Value Date   TSH 0.96 08/27/2011   BNP (last 3 results)  Recent Labs  08/27/11 1231  PROBNP 165.0*   Cardiac Panel (last 3 results) No results found for this basename: CKTOTAL, CKMB, TROPONINI, RELINDX,  in the last 72 hours  Iron/TIBC/Ferritin No results found for this basename: iron, tibc, ferritin     EKG Orders placed in visit on 05/11/12  . EKG 12-LEAD     Prior Assessment and Plan Problem List as of 08/03/2012     Cardiovascular and Mediastinum   Essential hypertension, benign   Last Assessment & Plan   06/30/2012 Office Visit Written 06/30/2012  4:33 PM by Jodelle Gross, NP     Blood pressure is only moderately controlled for this patient with significant systolic dysfunction. With titration up of Coreg will follow closely concerning her blood pressure and heart rate. Heart rate today was in the high 90s. I spent a considerable amount of time talking with the patient and her daughter concerning her cardiomyopathy and treatment regimen ( 30 minutes). We will have close followup with labs.    Chronic combined systolic and diastolic heart failure   Last Assessment & Plan   06/30/2012 Office Visit Written 06/30/2012  4:31 PM by Jodelle Gross, NP     Alicia Davidson is reassuring for evidence of ischemia, and therefore cardiac catheterization will not be planned. Due to significant LV systolic dysfunction, I will optimize her medications. I will increase her core 6.25 mg twice a day and begin digoxin 0.125 mg daily. She will continue Lasix and ARB. Reluctant to begin spironolactone at this time. We will start out slow with medication adjustments and additions. She will followup with the BMET next week, and will be seen again in one month. Would like to see her on a monthly basis for a short period of time to evaluate her symptoms and progress. We will repeat echocardiogram in approximately 3 months to evaluate LV function on medication optimization.    Mitral regurgitation    Last Assessment & Plan   06/16/2012 Office Visit Edited 06/16/2012 12:15 PM by Jodelle Gross, NP     Per echocardiogram the patient  was found to have moderate mitral regurg, with a moderately dilated left atrium and mildly to markedly dilated right ventricle. Continue heart rate and blood pressure control.      Respiratory   COPD GOLD II   Last Assessment & Plan   06/30/2012 Office Visit Written 06/30/2012  4:34 PM by Jodelle Gross, NP     She will continue to follow with [name] for ongoing assessment and management.      Other   Dyspnea   Last Assessment & Plan   06/30/2012 Office Visit Written 06/30/2012  4:34 PM by Jodelle Gross, NP     This may be related to cardiomyopathy in conjunction with COPD. She is instructed to use Xopenex half before using nebulizer treatments to avoid increasing heart rate. She is also advised to wear oxygen at night as she has been prescribed to do so, and also when oxygen consumption has declined with an saturation of 88%.     Smoking   Last Assessment & Plan   10/14/2011 Office Visit Written 10/14/2011  3:24 PM by Nyoka Cowden, MD     I reviewed the Flethcher curve with patient that basically indicates  if you quit smoking when your best day FEV1 is still well preserved (which hers is) it is highly unlikely you will progress to severe disease and informed the patient there was no medication on the market that has proven to change the curve or the likelihood of progression.  Therefore stopping smoking and maintaining abstinence is the most important aspect of care, not choice of inhalers or for that matter, doctors.   Pulmonary f/u can be prn    Anemia   Last Assessment & Plan   08/27/2011 Office Visit Written 08/27/2011  3:22 PM by Nyoka Cowden, MD       Lab 08/27/11 1231  HGB 8.6 Repeated and verified X2.*    Normocytic with recent gi w/u neg in [place], should continue Fe for now and f/u with primary        Imaging: No results  found.

## 2012-10-16 ENCOUNTER — Ambulatory Visit (HOSPITAL_COMMUNITY)
Admission: RE | Admit: 2012-10-16 | Discharge: 2012-10-16 | Disposition: A | Discharge status: Home / Self Care | Payer: Medicare HMO | Source: Ambulatory Visit | Attending: Adult Health | Admitting: Adult Health

## 2012-10-16 DIAGNOSIS — R0989 Other specified symptoms and signs involving the circulatory and respiratory systems: Secondary | ICD-10-CM | POA: Insufficient documentation

## 2012-10-16 DIAGNOSIS — I5042 Chronic combined systolic (congestive) and diastolic (congestive) heart failure: Secondary | ICD-10-CM

## 2012-10-16 DIAGNOSIS — I059 Rheumatic mitral valve disease, unspecified: Secondary | ICD-10-CM

## 2012-10-16 DIAGNOSIS — I10 Essential (primary) hypertension: Secondary | ICD-10-CM | POA: Insufficient documentation

## 2012-10-16 DIAGNOSIS — J449 Chronic obstructive pulmonary disease, unspecified: Secondary | ICD-10-CM | POA: Insufficient documentation

## 2012-10-16 DIAGNOSIS — Z87891 Personal history of nicotine dependence: Secondary | ICD-10-CM | POA: Insufficient documentation

## 2012-10-16 DIAGNOSIS — I509 Heart failure, unspecified: Secondary | ICD-10-CM | POA: Insufficient documentation

## 2012-10-16 DIAGNOSIS — R0609 Other forms of dyspnea: Secondary | ICD-10-CM | POA: Insufficient documentation

## 2012-10-16 DIAGNOSIS — J4489 Other specified chronic obstructive pulmonary disease: Secondary | ICD-10-CM | POA: Insufficient documentation

## 2012-10-16 NOTE — Progress Notes (Signed)
*  PRELIMINARY RESULTS* Echocardiogram 2D Echocardiogram has been performed.  Alicia Davidson 10/16/2012, 3:40 PM

## 2012-10-20 ENCOUNTER — Telehealth: Payer: Self-pay | Admitting: *Deleted

## 2012-10-20 ENCOUNTER — Ambulatory Visit (INDEPENDENT_AMBULATORY_CARE_PROVIDER_SITE_OTHER): Payer: Medicare HMO | Admitting: Adult Health

## 2012-10-20 ENCOUNTER — Encounter: Payer: Self-pay | Admitting: Adult Health

## 2012-10-20 VITALS — BP 141/73 | HR 68 | Ht 59.0 in | Wt 136.0 lb

## 2012-10-20 DIAGNOSIS — I10 Essential (primary) hypertension: Secondary | ICD-10-CM

## 2012-10-20 DIAGNOSIS — J390 Retropharyngeal and parapharyngeal abscess: Secondary | ICD-10-CM

## 2012-10-20 DIAGNOSIS — J449 Chronic obstructive pulmonary disease, unspecified: Secondary | ICD-10-CM

## 2012-10-20 DIAGNOSIS — I5042 Chronic combined systolic (congestive) and diastolic (congestive) heart failure: Secondary | ICD-10-CM

## 2012-10-20 MED ORDER — DIGOXIN 125 MCG PO TABS: 0.1250 mg | ORAL_TABLET | Freq: Every day | ORAL | Status: DC

## 2012-10-20 MED ORDER — FUROSEMIDE 20 MG PO TABS: 40.0000 mg | ORAL_TABLET | Freq: Every day | ORAL | Status: DC

## 2012-10-20 MED ORDER — CARVEDILOL 6.25 MG PO TABS: 6.2500 mg | ORAL_TABLET | Freq: Two times a day (BID) | ORAL | Status: DC

## 2012-10-20 MED ORDER — POTASSIUM CHLORIDE CRYS ER 20 MEQ PO TBCR: 20.0000 meq | EXTENDED_RELEASE_TABLET | Freq: Every day | ORAL | Status: DC

## 2012-10-20 NOTE — Telephone Encounter (Signed)
.  left message to have patient return my call.to advise the call has been made per pt has equipment from [place], currently still pending confirmation with [employer] at [phone]

## 2012-10-20 NOTE — Assessment & Plan Note (Signed)
Good control of BP at this time. No changes in medication regimen.

## 2012-10-20 NOTE — Assessment & Plan Note (Signed)
She is to continue follow up with pulmonologist in [place]. She will stop her HS O2 for now. Continue neb tx.

## 2012-10-20 NOTE — Progress Notes (Deleted)
Name: Demaria Deeney    DOB: 04-05-1930  Age: 79 y.o.  MR#: [id number]       PCP:  Louie Boston, MD      Insurance: Payor: [employer] MEDICARE / Plan: [employer] MEDICARE HMO / Product Type: *No Product type* /   CC:    Chief Complaint  Patient presents with  . Congestive Heart Failure    Combined  . Hypertension    VS Filed Vitals:   10/20/12 1404  BP: 141/73  Pulse: 68  Height: 4\' 11"  (1.499 m)  Weight: 136 lb (61.689 kg)    Weights Current Weight  10/20/12 136 lb (61.689 kg)  08/03/12 138 lb (62.596 kg)  06/30/12 136 lb 1.9 oz (61.744 kg)    Blood Pressure  BP Readings from Last 3 Encounters:  10/20/12 141/73  08/03/12 134/62  06/30/12 134/86     Admit date:  (Not on file) Last encounter with RMR:  08/03/2012   Allergy Review of patient's allergies indicates no known allergies.  Current Outpatient Prescriptions  Medication Sig Dispense Refill  . Ascorbic Acid (VITA-C PO) Take 1 tablet by mouth 2 (two) times daily.      Marland Kitchen aspirin 81 MG tablet Take 81 mg by mouth daily.      Marland Kitchen atorvastatin (LIPITOR) 20 MG tablet Take 40 mg by mouth daily.       . carvedilol (COREG) 6.25 MG tablet Take 1 tablet (6.25 mg total) by mouth 2 (two) times daily.  180 tablet  3  . digoxin (LANOXIN) 0.125 MG tablet Take 1 tablet (0.125 mg total) by mouth daily.  90 tablet  3  . FOLIC ACID PO Take 100 mcg by mouth daily.      . furosemide (LASIX) 20 MG tablet Take 2 tablets (40 mg total) by mouth daily.  45 tablet  6  . ipratropium-albuterol (DUONEB) 0.5-2.5 (3) MG/3ML SOLN Take 3 mLs by nebulization every 6 (six) hours as needed.      . Iron TABS Take 1 tablet by mouth 2 (two) times daily.      Marland Kitchen levalbuterol (XOPENEX HFA) 45 MCG/ACT inhaler Inhale 2 puffs into the lungs every 6 (six) hours as needed.      Marland Kitchen LORazepam (ATIVAN) 0.5 MG tablet Take 0.5 mg by mouth daily as needed.      . Multiple Vitamins-Minerals (CENTRUM SILVER) tablet Take 1 tablet by mouth daily.      Marland Kitchen omeprazole (PRILOSEC) 40 MG  capsule Take 40 mg by mouth daily.      . potassium chloride SA (K-DUR,KLOR-CON) 20 MEQ tablet Take 20 mEq by mouth daily.      . valsartan (DIOVAN) 160 MG tablet Take 160 mg by mouth daily.       No current facility-administered medications for this visit.    Discontinued Meds:   There are no discontinued medications.  Patient Active Problem List   Diagnosis Date Noted  . Chronic combined systolic and diastolic heart failure 05/11/2012  . Mitral regurgitation 05/11/2012  . Dyspnea 08/27/2011  . COPD GOLD II 08/27/2011  . Smoking 08/27/2011  . Anemia 08/27/2011  . Essential hypertension, benign 08/27/2011    LABS    Component Value Date/Time   NA 139 07/07/2012 1000   NA 137 05/21/2012 0250   NA 137 08/27/2011 1231   K 4.9 07/07/2012 1000   K 4.1 05/21/2012 0250   K 4.6 08/27/2011 1231   CL 101 07/07/2012 1000   CL 99 05/21/2012 0250  CL 102 08/27/2011 1231   CO2 28 07/07/2012 1000   CO2 27 05/21/2012 0250   CO2 27 08/27/2011 1231   GLUCOSE 83 07/07/2012 1000   GLUCOSE 87 05/21/2012 0250   GLUCOSE 94 08/27/2011 1231   BUN 26* 07/07/2012 1000   BUN 26* 05/21/2012 0250   BUN 18 08/27/2011 1231   CREATININE 1.05 07/07/2012 1000   CREATININE 1.11* 05/21/2012 0250   CREATININE 0.9 08/27/2011 1231   CALCIUM 9.4 07/07/2012 1000   CALCIUM 10.0 05/21/2012 0250   CALCIUM 9.8 08/27/2011 1231   CMP     Component Value Date/Time   NA 139 07/07/2012 1000   K 4.9 07/07/2012 1000   CL 101 07/07/2012 1000   CO2 28 07/07/2012 1000   GLUCOSE 83 07/07/2012 1000   BUN 26* 07/07/2012 1000   CREATININE 1.05 07/07/2012 1000   CREATININE 0.9 08/27/2011 1231   CALCIUM 9.4 07/07/2012 1000       Component Value Date/Time   WBC 7.9 08/27/2011 1231   HGB 8.6 Repeated and verified X2.* 08/27/2011 1231   HCT 27.0* 08/27/2011 1231   MCV 81.9 08/27/2011 1231    Lipid Panel  No results found for this basename: chol, trig, hdl, cholhdl, vldl, ldlcalc    ABG No results found for this basename: phart, pco2, pco2art, po2, po2art,  hco3, tco2, acidbasedef, o2sat     Lab Results  Component Value Date   TSH 0.96 08/27/2011   BNP (last 3 results) No results found for this basename: PROBNP,  in the last 8760 hours Cardiac Panel (last 3 results) No results found for this basename: CKTOTAL, CKMB, TROPONINI, RELINDX,  in the last 72 hours  Iron/TIBC/Ferritin No results found for this basename: iron, tibc, ferritin     EKG Orders placed in visit on 05/11/12  . EKG 12-LEAD     Prior Assessment and Plan Problem List as of 10/20/2012   Dyspnea   Last Assessment & Plan   08/03/2012 Office Visit Written 08/03/2012  4:41 PM by Jodelle Gross, NP     This is related to chronic COPD. There is no wheezes or productive coughing on exam. No changes in her medicines.    COPD GOLD II   Last Assessment & Plan   06/30/2012 Office Visit Written 06/30/2012  4:34 PM by Jodelle Gross, NP     She will continue to follow with [name] for ongoing assessment and management.    Smoking   Last Assessment & Plan   10/14/2011 Office Visit Written 10/14/2011  3:24 PM by Nyoka Cowden, MD     I reviewed the Flethcher curve with patient that basically indicates  if you quit smoking when your best day FEV1 is still well preserved (which hers is) it is highly unlikely you will progress to severe disease and informed the patient there was no medication on the market that has proven to change the curve or the likelihood of progression.  Therefore stopping smoking and maintaining abstinence is the most important aspect of care, not choice of inhalers or for that matter, doctors.   Pulmonary f/u can be prn    Anemia   Last Assessment & Plan   08/27/2011 Office Visit Written 08/27/2011  3:22 PM by Nyoka Cowden, MD       Lab 08/27/11 1231  HGB 8.6 Repeated and verified X2.*    Normocytic with recent gi w/u neg in [place], should continue Fe for now and f/u with primary  Essential hypertension, benign   Last Assessment & Plan    08/03/2012 Office Visit Written 08/03/2012  4:41 PM by Jodelle Gross, NP     Blood pressure is well-controlled. Continue current dose of Coreg and Diovan.    Chronic combined systolic and diastolic heart failure   Last Assessment & Plan   08/03/2012 Office Visit Edited 08/03/2012  4:40 PM by Jodelle Gross, NP     She is doing well. She has some positional changes of dizziness. Orthostatics are negative with BP rising with standing 234 systolic. Heart rate remains 57-60.  Will continue current medications without changes. I discussed with her daughter to continue to monitor her her vital signs on a daily basis. She is report any symptoms. I discussed My Chart with her and advised her to send me an e-mail for any questions she may have. We will followup with an echocardiogram in 3 months, and a BMET.    Mitral regurgitation   Last Assessment & Plan   06/16/2012 Office Visit Edited 06/16/2012 12:15 PM by Jodelle Gross, NP     Per echocardiogram the patient was found to have moderate mitral regurg, with a moderately dilated left atrium and mildly to markedly dilated right ventricle. Continue heart rate and blood pressure control.        Imaging: No results found.

## 2012-10-20 NOTE — Telephone Encounter (Signed)
Pt had OV today to advise that her insurance needs an order for the pt to continue to receive O2 QHS per noted pt was advised in [place] to have O2 qhs and once moving to [place] the pt cardiology MD wants her to continue this therapy however they need an order called in, rep Princess advised she is with customer service and will route me to the pharmacy rep, phone call was dropped, will call again

## 2012-10-20 NOTE — Progress Notes (Signed)
HPI: [name] is an 79 year old patient of Dr. Diona Browner we are following for ongoing assessment and management of combined CHF with an EF of 30-35%, hypertension, with ongoing complaints of dizziness. Patient was last in the office in July of 2014, patient's medications were not changed at that time. She was to have a followup echocardiogram and BMET. Was felt that her chronic dyspnea was related to COPD.   Echocardiogram completed on 10/16/2012 demonstrated improvement in ejection fraction to 40-45%, continued grade 1 diastolic dysfunction, AoV had mildly calcified annulus without stenosis, she to mildly reduced systolic function of the right ventricle. The atrium was also mildly dilated.   She is feeling some better. She is no longer in need of O2 at night, as she sleeps better and BP is better without it.     No Known Allergies  Current Outpatient Prescriptions  Medication Sig Dispense Refill  . Ascorbic Acid (VITA-C PO) Take 1 tablet by mouth 2 (two) times daily.      Marland Kitchen aspirin 81 MG tablet Take 81 mg by mouth daily.      Marland Kitchen atorvastatin (LIPITOR) 20 MG tablet Take 40 mg by mouth daily.       . carvedilol (COREG) 6.25 MG tablet Take 1 tablet (6.25 mg total) by mouth 2 (two) times daily.  180 tablet  3  . digoxin (LANOXIN) 0.125 MG tablet Take 1 tablet (0.125 mg total) by mouth daily.  90 tablet  3  . FOLIC ACID PO Take 100 mcg by mouth daily.      . furosemide (LASIX) 20 MG tablet Take 2 tablets (40 mg total) by mouth daily.  45 tablet  6  . ipratropium-albuterol (DUONEB) 0.5-2.5 (3) MG/3ML SOLN Take 3 mLs by nebulization every 6 (six) hours as needed.      . Iron TABS Take 1 tablet by mouth 2 (two) times daily.      Marland Kitchen levalbuterol (XOPENEX HFA) 45 MCG/ACT inhaler Inhale 2 puffs into the lungs every 6 (six) hours as needed.      Marland Kitchen LORazepam (ATIVAN) 0.5 MG tablet Take 0.5 mg by mouth daily as needed.      . Multiple Vitamins-Minerals (CENTRUM SILVER) tablet Take 1 tablet by mouth  daily.      Marland Kitchen omeprazole (PRILOSEC) 40 MG capsule Take 40 mg by mouth daily.      . potassium chloride SA (K-DUR,KLOR-CON) 20 MEQ tablet Take 20 mEq by mouth daily.      . valsartan (DIOVAN) 160 MG tablet Take 160 mg by mouth daily.       No current facility-administered medications for this visit.    Past Medical History  Diagnosis Date  . COPD (chronic obstructive pulmonary disease)   . Essential hypertension, benign   . Hypercholesteremia   . Osteoporosis   . B12 deficiency   . Anemia associated with acute blood loss   . Gastric ulcer with hemorrhage but without obstruction   . Weight loss, non-intentional   . Anxiety   . Umbilical hernia   . Gastritis     Mild  . Chronic diastolic heart failure     Diagnosed [place] March 2014  . Cancer   . CHF (congestive heart failure)     History reviewed. No pertinent past surgical history.  WUJ:[id number] of systems complete and found to be negative unless listed above  PHYSICAL EXAM BP 141/73  Pulse 68  Ht 4\' 11"  (1.499 m)  Wt 136 lb (61.689 kg)  BMI  27.45 kg/m2  General: Well developed, well nourished, in no acute distress Head: Eyes PERRLA, No xanthomas.   Normal cephalic and atramatic  Lungs: Mild bilateral crackles in the bases.  Heart: HRRR S1 S2,  Soft systolic murmur..  Pulses are 2+ & equal.            No carotid bruit. No JVD.  No abdominal bruits. No femoral bruits. Abdomen: Bowel sounds are positive, abdomen soft and non-tender without masses or                  Hernia's noted. Msk:  Back normal, normal gait. Normal strength and tone for age. Extremities: No clubbing, cyanosis or edema.  DP +1 Neuro: Alert and oriented X 3. Psych:  Good affect, responds appropriately    ASSESSMENT AND PLAN

## 2012-10-20 NOTE — Patient Instructions (Addendum)
Your physician recommends that you schedule a follow-up appointment in: 6 MONTHS  REFILLS HAVE BEEN SENT FOR YOUR COREG, LASIX, DIGOXIN,POTASSIUM, THE REMAINDER OF YOUR MEDICATIONS WILL NEED TO BE FILLED BY YOUR PRIMARY CARE PHYSICIAN   WE WILL CONTACT YOUR INSURANCE COMPANY CONCERNING YOUR OXYGEN WITHIN THE NEXT 2 BUSINESS DAYS TO ADVISE

## 2012-10-20 NOTE — Assessment & Plan Note (Signed)
She is well compensated without evidence of fluid overload. Wt is stable. Echo demonstrated improved LV fx. Will continue current medication regimen. She will have follow up in 6 months.

## 2012-10-27 NOTE — Telephone Encounter (Signed)
.  left message to have patient return my call.  

## 2012-10-29 ENCOUNTER — Encounter: Payer: Self-pay | Admitting: *Deleted

## 2012-10-29 ENCOUNTER — Other Ambulatory Visit: Payer: Self-pay | Admitting: *Deleted

## 2012-10-29 DIAGNOSIS — I10 Essential (primary) hypertension: Secondary | ICD-10-CM

## 2012-10-29 DIAGNOSIS — I5042 Chronic combined systolic (congestive) and diastolic (congestive) heart failure: Secondary | ICD-10-CM

## 2012-10-30 NOTE — Telephone Encounter (Signed)
.  left message to have patient return my call.  

## 2012-11-02 ENCOUNTER — Other Ambulatory Visit (HOSPITAL_COMMUNITY): Payer: Medicare HMO

## 2012-11-02 NOTE — Telephone Encounter (Signed)
Contacted pt daughter Agustin Cree to advise [employer] rep Brett Canales advised they did not need to verify this for the pt, the daughter advised she has already sent the request back through and no further assistance is needed from our office however she will call our office with any further concerns going forward

## 2013-01-26 ENCOUNTER — Emergency Department (HOSPITAL_COMMUNITY): Payer: Medicare HMO

## 2013-01-26 ENCOUNTER — Observation Stay (HOSPITAL_COMMUNITY)
Admission: EM | Admit: 2013-01-26 | Discharge: 2013-01-28 | Disposition: A | Discharge status: Home / Self Care | Payer: Medicare HMO | Attending: Internal Medicine | Admitting: Internal Medicine

## 2013-01-26 ENCOUNTER — Encounter (HOSPITAL_COMMUNITY): Payer: Self-pay | Admitting: Emergency Medicine

## 2013-01-26 DIAGNOSIS — I519 Heart disease, unspecified: Secondary | ICD-10-CM | POA: Insufficient documentation

## 2013-01-26 DIAGNOSIS — I5042 Chronic combined systolic (congestive) and diastolic (congestive) heart failure: Secondary | ICD-10-CM

## 2013-01-26 DIAGNOSIS — C3490 Malignant neoplasm of unspecified part of unspecified bronchus or lung: Secondary | ICD-10-CM

## 2013-01-26 DIAGNOSIS — I10 Essential (primary) hypertension: Secondary | ICD-10-CM

## 2013-01-26 DIAGNOSIS — Z23 Encounter for immunization: Secondary | ICD-10-CM | POA: Insufficient documentation

## 2013-01-26 DIAGNOSIS — R911 Solitary pulmonary nodule: Secondary | ICD-10-CM | POA: Insufficient documentation

## 2013-01-26 DIAGNOSIS — R55 Syncope and collapse: Principal | ICD-10-CM | POA: Diagnosis present

## 2013-01-26 DIAGNOSIS — J449 Chronic obstructive pulmonary disease, unspecified: Secondary | ICD-10-CM

## 2013-01-26 HISTORY — DX: Cardiomyopathy, unspecified: I42.9

## 2013-01-26 HISTORY — DX: Chronic systolic (congestive) heart failure: I50.22

## 2013-01-26 LAB — TROPONIN I
Troponin I: <0.3 ng/mL (ref <0.30)
Troponin I: <0.3 ng/mL (ref <0.30)

## 2013-01-26 LAB — CBC WITH DIFFERENTIAL/PLATELET
BASOS ABS: 0 10*3/uL (ref 0.0–0.1)
Basophils Relative: 1 % (ref 0–1)
EOS PCT: 1 % (ref 0–5)
Eosinophils Absolute: 0.1 10*3/uL (ref 0.0–0.7)
HCT: 35.7 % — ABNORMAL LOW (ref 36.0–46.0)
Hemoglobin: 11.6 g/dL — ABNORMAL LOW (ref 12.0–15.0)
LYMPHS ABS: 1.7 10*3/uL (ref 0.7–4.0)
LYMPHS PCT: 19 % (ref 12–46)
MCH: 30.4 pg (ref 26.0–34.0)
MCHC: 32.5 g/dL (ref 30.0–36.0)
MCV: 93.7 fL (ref 78.0–100.0)
Monocytes Absolute: 0.7 10*3/uL (ref 0.1–1.0)
Monocytes Relative: 8 % (ref 3–12)
NEUTROS ABS: 6.2 10*3/uL (ref 1.7–7.7)
Neutrophils Relative %: 71 % (ref 43–77)
PLATELETS: 325 10*3/uL (ref 150–400)
RBC: 3.81 MIL/uL — AB (ref 3.87–5.11)
RDW: 13.6 % (ref 11.5–15.5)
WBC: 8.7 10*3/uL (ref 4.0–10.5)

## 2013-01-26 LAB — COMPREHENSIVE METABOLIC PANEL
ALK PHOS: 82 U/L (ref 39–117)
ALT: 28 U/L (ref 0–35)
AST: 24 U/L (ref 0–37)
Albumin: 3.6 g/dL (ref 3.5–5.2)
BILIRUBIN TOTAL: 0.2 mg/dL — AB (ref 0.3–1.2)
BUN: 27 mg/dL — ABNORMAL HIGH (ref 6–23)
CALCIUM: 9.6 mg/dL (ref 8.4–10.5)
CHLORIDE: 100 meq/L (ref 96–112)
CO2: 29 meq/L (ref 19–32)
Creatinine, Ser: 1.11 mg/dL — ABNORMAL HIGH (ref 0.50–1.10)
GFR, EST AFRICAN AMERICAN: 53 mL/min — AB (ref >90)
GFR, EST NON AFRICAN AMERICAN: 46 mL/min — AB (ref >90)
GLUCOSE: 120 mg/dL — AB (ref 70–99)
POTASSIUM: 4.3 meq/L (ref 3.7–5.3)
SODIUM: 140 meq/L (ref 137–147)
Total Protein: 7.3 g/dL (ref 6.0–8.3)

## 2013-01-26 LAB — PRO B NATRIURETIC PEPTIDE: Pro B Natriuretic peptide (BNP): 621.9 pg/mL — ABNORMAL HIGH (ref 0–450)

## 2013-01-26 LAB — D-DIMER, QUANTITATIVE: D-Dimer, Quant: 0.62 ug/mL-FEU — ABNORMAL HIGH (ref 0.00–0.48)

## 2013-01-26 LAB — DIGOXIN LEVEL: Digoxin Level: 0.9 ng/mL (ref 0.8–2.0)

## 2013-01-26 MED ORDER — FUROSEMIDE 20 MG PO TABS
20.0000 mg | ORAL_TABLET | Freq: Every day | ORAL | Status: DC
  Administered 2013-01-27 – 2013-01-28 (×2): 20 mg via ORAL
  Filled 2013-01-26 (×2): qty 1

## 2013-01-26 MED ORDER — IRBESARTAN 150 MG PO TABS
150.0000 mg | ORAL_TABLET | Freq: Every day | ORAL | Status: DC
  Administered 2013-01-27 – 2013-01-28 (×2): 150 mg via ORAL
  Filled 2013-01-26 (×2): qty 1

## 2013-01-26 MED ORDER — HYDRALAZINE HCL 25 MG PO TABS: 25.0000 mg | ORAL_TABLET | Freq: Four times a day (QID) | ORAL | Status: DC | PRN

## 2013-01-26 MED ORDER — ASPIRIN 81 MG PO CHEW
81.0000 mg | CHEWABLE_TABLET | Freq: Every day | ORAL | Status: DC
  Administered 2013-01-27 – 2013-01-28 (×2): 81 mg via ORAL
  Filled 2013-01-26 (×2): qty 1

## 2013-01-26 MED ORDER — SODIUM CHLORIDE 0.9 % IV SOLN: 250.0000 mL | INTRAVENOUS | Status: DC | PRN

## 2013-01-26 MED ORDER — LORAZEPAM 0.5 MG PO TABS: 0.5000 mg | ORAL_TABLET | Freq: Every day | ORAL | Status: DC | PRN

## 2013-01-26 MED ORDER — ONDANSETRON HCL 4 MG PO TABS: 4.0000 mg | ORAL_TABLET | Freq: Four times a day (QID) | ORAL | Status: DC | PRN

## 2013-01-26 MED ORDER — ONDANSETRON HCL 4 MG/2ML IJ SOLN: 4.0000 mg | Freq: Four times a day (QID) | INTRAMUSCULAR | Status: DC | PRN

## 2013-01-26 MED ORDER — IPRATROPIUM-ALBUTEROL 0.5-2.5 (3) MG/3ML IN SOLN: 3.0000 mL | Freq: Four times a day (QID) | RESPIRATORY_TRACT | Status: DC | PRN

## 2013-01-26 MED ORDER — CARVEDILOL 3.125 MG PO TABS
6.2500 mg | ORAL_TABLET | Freq: Two times a day (BID) | ORAL | Status: DC
  Administered 2013-01-26 – 2013-01-28 (×4): 6.25 mg via ORAL
  Filled 2013-01-26 (×4): qty 2

## 2013-01-26 MED ORDER — IOHEXOL 350 MG/ML SOLN
100.0000 mL | Freq: Once | INTRAVENOUS | Status: AC | PRN
  Administered 2013-01-26: 100 mL via INTRAVENOUS

## 2013-01-26 MED ORDER — SODIUM CHLORIDE 0.9 % IV BOLUS (SEPSIS)
500.0000 mL | Freq: Once | INTRAVENOUS | Status: AC
  Administered 2013-01-26: 500 mL via INTRAVENOUS

## 2013-01-26 MED ORDER — DIGOXIN 125 MCG PO TABS
0.1250 mg | ORAL_TABLET | Freq: Every day | ORAL | Status: DC
  Administered 2013-01-27 – 2013-01-28 (×2): 0.125 mg via ORAL
  Filled 2013-01-26 (×2): qty 1

## 2013-01-26 MED ORDER — NITROGLYCERIN 0.4 MG SL SUBL
0.4000 mg | SUBLINGUAL_TABLET | Freq: Once | SUBLINGUAL | Status: AC
  Administered 2013-01-26: 0.4 mg via SUBLINGUAL

## 2013-01-26 MED ORDER — HYDROCODONE-ACETAMINOPHEN 5-325 MG PO TABS
1.0000 | ORAL_TABLET | Freq: Once | ORAL | Status: DC
  Filled 2013-01-26: qty 1

## 2013-01-26 MED ORDER — POTASSIUM CHLORIDE CRYS ER 20 MEQ PO TBCR
20.0000 meq | EXTENDED_RELEASE_TABLET | Freq: Every day | ORAL | Status: DC
  Administered 2013-01-27 – 2013-01-28 (×2): 20 meq via ORAL
  Filled 2013-01-26 (×2): qty 1

## 2013-01-26 MED ORDER — ENOXAPARIN SODIUM 40 MG/0.4ML ~~LOC~~ SOLN
40.0000 mg | SUBCUTANEOUS | Status: DC
  Administered 2013-01-26 – 2013-01-27 (×2): 40 mg via SUBCUTANEOUS
  Filled 2013-01-26 (×2): qty 0.4

## 2013-01-26 MED ORDER — SODIUM CHLORIDE 0.9 % IJ SOLN: 3.0000 mL | INTRAMUSCULAR | Status: DC | PRN

## 2013-01-26 MED ORDER — ATORVASTATIN CALCIUM 20 MG PO TABS
20.0000 mg | ORAL_TABLET | Freq: Every day | ORAL | Status: DC
  Administered 2013-01-26 – 2013-01-27 (×2): 20 mg via ORAL
  Filled 2013-01-26 (×2): qty 1

## 2013-01-26 MED ORDER — SODIUM CHLORIDE 0.9 % IJ SOLN
3.0000 mL | Freq: Two times a day (BID) | INTRAMUSCULAR | Status: DC
  Administered 2013-01-26 – 2013-01-28 (×4): 3 mL via INTRAVENOUS

## 2013-01-26 MED ORDER — HYDROCODONE-ACETAMINOPHEN 5-325 MG PO TABS: 1.0000 | ORAL_TABLET | ORAL | Status: DC | PRN

## 2013-01-26 NOTE — ED Provider Notes (Addendum)
CSN: [id number]     Arrival date & time 01/26/13  1523 History  This chart was scribed for [name], MD by Eugenia Mcalpine, ED Scribe. This patient was seen in room [facility]2/[facility]2 and the patient's care was started at 3:54 PM.    Chief Complaint  Patient presents with  . Near Syncope   (Consider location/radiation/quality/duration/timing/severity/associated sxs/prior Treatment) Patient is a 80 y.o. female presenting with near-syncope and weakness. The history is provided by the patient and a relative. No language interpreter was used.  Near Syncope This is a new problem. The current episode started 1 to 2 hours ago. The problem occurs rarely. The problem has been resolved. She has tried nothing for the symptoms.  Weakness This is a new problem. The current episode started 1 to 2 hours ago. The problem occurs rarely. The problem has been resolved. She has tried nothing for the symptoms.   HPI Comments: Alicia Davidson is a 80 y.o. female who presents to the Emergency Department complaining of sudden onset hot feeling, diaphoresis, dizziness, and visual disturbances for about 30 minutes. Pt's daughter states that her mother felt like she had a film over her eyes.  She denies LOC.  Pt has a hx of heart attack. Pt's symptoms have currently resolved upon arrival to the ED.  [name], MD  Past Medical History  Diagnosis Date  . COPD (chronic obstructive pulmonary disease)   . Essential hypertension, benign   . Hypercholesteremia   . Osteoporosis   . B12 deficiency   . Anemia associated with acute blood loss   . Gastric ulcer with hemorrhage but without obstruction   . Weight loss, non-intentional   . Anxiety   . Umbilical hernia   . Gastritis     Mild  . Chronic diastolic heart failure     Diagnosed [place] March 2014  . Cancer   . CHF (congestive heart failure)    History reviewed. No pertinent past surgical history. Family History  Problem Relation Age of Onset  . Heart disease  Father   . Colon cancer Sister   . Kidney failure Brother   . Other Mother     Bright's disease   History  Substance Use Topics  . Smoking status: Former Smoker -- 0.30 packs/day for 56 years    Types: Cigarettes    Quit date: 01/07/2012  . Smokeless tobacco: Never Used  . Alcohol Use: No   OB History   Grav Para Term Preterm Abortions TAB SAB Ect Mult Living                 Review of Systems  Constitutional: Positive for diaphoresis. Negative for appetite change and fatigue.  HENT: Negative for congestion, ear discharge and sinus pressure.   Eyes: Negative for discharge.  Respiratory: Negative for cough.   Cardiovascular: Positive for near-syncope.  Gastrointestinal: Negative for diarrhea.  Genitourinary: Negative for frequency and hematuria.  Musculoskeletal: Negative for back pain.  Skin: Negative for rash.  Neurological: Positive for weakness. Negative for seizures.  Psychiatric/Behavioral: Negative for hallucinations.  All other systems reviewed and are negative.    Allergies  Review of patient's allergies indicates no known allergies.  Home Medications   Current Outpatient Rx  Name  Route  Sig  Dispense  Refill  . Ascorbic Acid (VITA-C PO)   Oral   Take 1 tablet by mouth 2 (two) times daily.         Marland Kitchen aspirin 81 MG tablet   Oral  Take 81 mg by mouth daily.         Marland Kitchen atorvastatin (LIPITOR) 20 MG tablet   Oral   Take 40 mg by mouth daily.          . carvedilol (COREG) 6.25 MG tablet   Oral   Take 1 tablet (6.25 mg total) by mouth 2 (two) times daily.   60 tablet   6   . digoxin (LANOXIN) 0.125 MG tablet   Oral   Take 1 tablet (0.125 mg total) by mouth daily.   30 tablet   6   . FOLIC ACID PO   Oral   Take 100 mcg by mouth daily.         . furosemide (LASIX) 20 MG tablet   Oral   Take 2 tablets (40 mg total) by mouth daily.   60 tablet   6   . ipratropium-albuterol (DUONEB) 0.5-2.5 (3) MG/3ML SOLN   Nebulization   Take 3 mLs by  nebulization every 6 (six) hours as needed.         . Iron TABS   Oral   Take 1 tablet by mouth 2 (two) times daily.         Marland Kitchen levalbuterol (XOPENEX HFA) 45 MCG/ACT inhaler   Inhalation   Inhale 2 puffs into the lungs every 6 (six) hours as needed.         Marland Kitchen LORazepam (ATIVAN) 0.5 MG tablet   Oral   Take 0.5 mg by mouth daily as needed.         . Multiple Vitamins-Minerals (CENTRUM SILVER) tablet   Oral   Take 1 tablet by mouth daily.         Marland Kitchen omeprazole (PRILOSEC) 40 MG capsule   Oral   Take 40 mg by mouth daily.         . potassium chloride SA (K-DUR,KLOR-CON) 20 MEQ tablet   Oral   Take 1 tablet (20 mEq total) by mouth daily.   30 tablet   6   . valsartan (DIOVAN) 160 MG tablet   Oral   Take 160 mg by mouth daily.          BP 104/43  Pulse 75  Temp(Src) 97.3 F (36.3 C)  Resp 20  Ht 4\' 11"  (1.499 m)  Wt 139 lb 4 oz (63.163 kg)  BMI 28.11 kg/m2  SpO2 97% Physical Exam  Nursing note and vitals reviewed. Constitutional: She is oriented to person, place, and time. She appears well-developed.  HENT:  Head: Normocephalic.  Eyes: Conjunctivae and EOM are normal. No scleral icterus.  Neck: Neck supple. No thyromegaly present.  Cardiovascular: Normal rate and regular rhythm.  Exam reveals no gallop and no friction rub.   No murmur heard. Pulmonary/Chest: No stridor. She has no wheezes. She has no rales. She exhibits no tenderness.  Abdominal: She exhibits no distension. There is no tenderness. There is no rebound.  Musculoskeletal: Normal range of motion. She exhibits no edema.  Lymphadenopathy:    She has no cervical adenopathy.  Neurological: She is oriented to person, place, and time. She exhibits normal muscle tone. Coordination normal.  Skin: No rash noted. No erythema.  Psychiatric: She has a normal mood and affect. Her behavior is normal.    ED Course  Procedures (including critical care time)  DIAGNOSTIC STUDIES: Oxygen Saturation is  97% on RA, adequate by my interpretation.    COORDINATION OF CARE: 4:03 PM- Pt advised of plan for treatment and  pt agrees.    Labs Review Labs Reviewed - No data to display Imaging Review No results found.  EKG Interpretation   None       MDM  Syncope,  admit  [name], MD 01/26/13 2000  The chart was scribed for me under my direct supervision.  I personally performed the history, physical, and medical decision making and all procedures in the evaluation of this patient.[name], MD 01/26/13 2000

## 2013-01-26 NOTE — H&P (Signed)
PCP:   [name], MD   Chief Complaint:  Dizziness  HPI:  80 year old female who  has a past medical history of COPD (chronic obstructive pulmonary disease); Essential hypertension, benign; Hypercholesteremia; Osteoporosis; B12 deficiency; Anemia associated with acute blood loss; Gastric ulcer with hemorrhage but without obstruction; Weight loss, non-intentional; Anxiety; Umbilical hernia; Gastritis; Chronic diastolic heart failure; Cancer; and CHF (congestive heart failure). Today presented to the ED after patient had an episode of diaphoresis and weakness, as per patient she was out today for shopping when suddenly she started feeling sweaty and dizzy with blurred vision but did not pass out. Patient says that it lasted for about one hour. Patient did not have chest pain but complained of chest tightness she denies shortness of breath. She denies nausea vomiting or diarrhea. She denies fever no dysuria urgency or frequency of urination.  Allergies:  No Known Allergies    Past Medical History  Diagnosis Date  . COPD (chronic obstructive pulmonary disease)   . Essential hypertension, benign   . Hypercholesteremia   . Osteoporosis   . B12 deficiency   . Anemia associated with acute blood loss   . Gastric ulcer with hemorrhage but without obstruction   . Weight loss, non-intentional   . Anxiety   . Umbilical hernia   . Gastritis     Mild  . Chronic diastolic heart failure     Diagnosed [place] March 2014  . Cancer   . CHF (congestive heart failure)     History reviewed. No pertinent past surgical history.  Prior to Admission medications   Medication Sig Start Date End Date Taking? Authorizing Provider  Ascorbic Acid (VITA-C PO) Take 1 tablet by mouth 2 (two) times daily.   Yes Historical Provider, MD  aspirin 81 MG tablet Take 81 mg by mouth daily.   Yes Historical Provider, MD  atorvastatin (LIPITOR) 20 MG tablet Take 20 mg by mouth at bedtime.   Yes Historical Provider,  MD  carvedilol (COREG) 6.25 MG tablet Take 1 tablet (6.25 mg total) by mouth 2 (two) times daily. 10/20/12  Yes Lendon Colonel, NP  digoxin (LANOXIN) 0.125 MG tablet Take 1 tablet (0.125 mg total) by mouth daily. 10/20/12  Yes Lendon Colonel, NP  FOLIC ACID PO Take 235 mcg by mouth daily.   Yes Historical Provider, MD  furosemide (LASIX) 20 MG tablet Take 20 mg by mouth daily.   Yes Historical Provider, MD  ipratropium-albuterol (DUONEB) 0.5-2.5 (3) MG/3ML SOLN Take 3 mLs by nebulization every 6 (six) hours as needed (shortness of breath).    Yes Historical Provider, MD  Iron TABS Take 1 tablet by mouth 2 (two) times daily.   Yes Historical Provider, MD  levalbuterol [facility] HFA) 45 MCG/ACT inhaler Inhale 2 puffs into the lungs every 6 (six) hours as needed.   Yes Historical Provider, MD  Multiple Vitamins-Minerals (CENTRUM SILVER) tablet Take 1 tablet by mouth daily.   Yes Historical Provider, MD  omeprazole (PRILOSEC OTC) 20 MG tablet Take 40 mg by mouth daily.   Yes Historical Provider, MD  potassium chloride SA (K-DUR,KLOR-CON) 20 MEQ tablet Take 1 tablet (20 mEq total) by mouth daily. 10/20/12  Yes Lendon Colonel, NP  valsartan (DIOVAN) 160 MG tablet Take 160 mg by mouth daily.   Yes Historical Provider, MD  LORazepam (ATIVAN) 0.5 MG tablet Take 0.5 mg by mouth daily as needed.    Historical Provider, MD    Social History:  reports that she quit  smoking about 12 months ago. Her smoking use included Cigarettes. She has a 16.8 pack-year smoking history. She has never used smokeless tobacco. She reports that she does not drink alcohol or use illicit drugs.  Family History  Problem Relation Age of Onset  . Heart disease Father   . Colon cancer Sister   . Kidney failure Brother   . Other Mother     Bright's disease     All the positives are listed in BOLD  Review of Systems:  HEENT: Headache, blurred vision, runny nose, sore throat Neck: Hypothyroidism,  hyperthyroidism,,lymphadenopathy Chest : Shortness of breath, history of COPD, Asthma Heart : Chest pain, history of coronary arterey disease GI:  Nausea, vomiting, diarrhea, constipation, GERD GU: Dysuria, urgency, frequency of urination, hematuria Neuro: Stroke, seizures, syncope Psych: Depression, anxiety, hallucinations   Physical Exam: Blood pressure 181/78, pulse 82, temperature 97.9 F (36.6 C), temperature source Oral, resp. rate 20, height _0  (1.499 m), weight 63.163 kg (139 lb 4 oz), SpO2 94.00%. Constitutional:   Patient is a well-developed and well-nourished female in no acute distress and cooperative with exam. Head: Normocephalic and atraumatic Mouth: Mucus membranes moist Eyes: PERRL, EOMI, conjunctivae normal Neck: Supple, No Thyromegaly Cardiovascular: RRR, S1 normal, S2 normal Pulmonary/Chest: CTAB, no wheezes, rales, or rhonchi Abdominal: Soft. Non-tender, non-distended, bowel sounds are normal, no masses, organomegaly, or guarding present.  Neurological: A&O x3, Strenght is normal and symmetric bilaterally, cranial nerve II-XII are grossly intact, no focal motor deficit, sensory intact to light touch bilaterally.  Extremities : Trace edema of the lower extremities   Labs on Admission:  Results for orders placed during the hospital encounter of 01/26/13 (from the past 48 hour(s))  CBC WITH DIFFERENTIAL     Status: Abnormal   Collection Time    01/26/13  4:25 PM      Result Value Range   WBC 8.7  4.0 - 10.5 K/uL   RBC 3.81 (*) 3.87 - 5.11 MIL/uL   Hemoglobin 11.6 (*) 12.0 - 15.0 g/dL   HCT 35.7 (*) 36.0 - 46.0 %   MCV 93.7  78.0 - 100.0 fL   MCH 30.4  26.0 - 34.0 pg   MCHC 32.5  30.0 - 36.0 g/dL   RDW 13.6  11.5 - 15.5 %   Platelets 325  150 - 400 K/uL   Neutrophils Relative % 71  43 - 77 %   Neutro Abs 6.2  1.7 - 7.7 K/uL   Lymphocytes Relative 19  12 - 46 %   Lymphs Abs 1.7  0.7 - 4.0 K/uL   Monocytes Relative 8  3 - 12 %   Monocytes Absolute 0.7   0.1 - 1.0 K/uL   Eosinophils Relative 1  0 - 5 %   Eosinophils Absolute 0.1  0.0 - 0.7 K/uL   Basophils Relative 1  0 - 1 %   Basophils Absolute 0.0  0.0 - 0.1 K/uL  COMPREHENSIVE METABOLIC PANEL     Status: Abnormal   Collection Time    01/26/13  4:25 PM      Result Value Range   Sodium 140  137 - 147 mEq/L   Potassium 4.3  3.7 - 5.3 mEq/L   Chloride 100  96 - 112 mEq/L   CO2 29  19 - 32 mEq/L   Glucose, Bld 120 (*) 70 - 99 mg/dL   BUN 27 (*) 6 - 23 mg/dL   Creatinine, Ser 1.11 (*) 0.50 - 1.10 mg/dL  Calcium 9.6  8.4 - 10.5 mg/dL   Total Protein 7.3  6.0 - 8.3 g/dL   Albumin 3.6  3.5 - 5.2 g/dL   AST 24  0 - 37 U/L   ALT 28  0 - 35 U/L   Alkaline Phosphatase 82  39 - 117 U/L   Total Bilirubin 0.2 (*) 0.3 - 1.2 mg/dL   GFR calc non Af Amer 46 (*) >90 mL/min   GFR calc Af Amer 53 (*) >90 mL/min   Comment: (NOTE)     The eGFR has been calculated using the CKD EPI equation.     This calculation has not been validated in all clinical situations.     eGFR's persistently <90 mL/min signify possible Chronic Kidney     Disease.  TROPONIN I     Status: None   Collection Time    01/26/13  4:25 PM      Result Value Range   Troponin I <0.30  <0.30 ng/mL   Comment:            Due to the release kinetics of cTnI,     a negative result within the first hours     of the onset of symptoms does not rule out     myocardial infarction with certainty.     If myocardial infarction is still suspected,     repeat the test at appropriate intervals.  PRO B NATRIURETIC PEPTIDE     Status: Abnormal   Collection Time    01/26/13  4:25 PM      Result Value Range   Pro B Natriuretic peptide (BNP) 621.9 (*) 0 - 450 pg/mL  DIGOXIN LEVEL     Status: None   Collection Time    01/26/13  4:25 PM      Result Value Range   Digoxin Level 0.9  0.8 - 2.0 ng/mL  D-DIMER, QUANTITATIVE     Status: Abnormal   Collection Time    01/26/13  4:25 PM      Result Value Range   D-Dimer, Quant 0.62 (*) 0.00 -  0.48 ug/mL-FEU   Comment:            AT THE INHOUSE ESTABLISHED CUTOFF     VALUE OF 0.48 ug/mL FEU,     THIS ASSAY HAS BEEN DOCUMENTED     IN THE LITERATURE TO HAVE     A SENSITIVITY AND NEGATIVE     PREDICTIVE VALUE OF AT LEAST     98 TO 99%.  THE TEST RESULT     SHOULD BE CORRELATED WITH     AN ASSESSMENT OF THE CLINICAL     PROBABILITY OF DVT / VTE.  TROPONIN I     Status: None   Collection Time    01/26/13  6:32 PM      Result Value Range   Troponin I <0.30  <0.30 ng/mL   Comment:            Due to the release kinetics of cTnI,     a negative result within the first hours     of the onset of symptoms does not rule out     myocardial infarction with certainty.     If myocardial infarction is still suspected,     repeat the test at appropriate intervals.    Radiological Exams on Admission: Dg Chest 2 View  01/26/2013   CLINICAL DATA:  Near-syncope  EXAM: CHEST  2 VIEW  COMPARISON:  01/09/2012  FINDINGS: Heart is mildly enlarged. Interstitial prominence of bronchitic changes are stable. Increased AP diameter of the chest is a feature of COPD. Stable thoracic spine.  IMPRESSION: Cardiomegaly without decompensation.  Chronic changes.   Electronically Signed   By: Maryclare Bean M.D.   On: 01/26/2013 17:07   Ct Head Wo Contrast  01/26/2013   CLINICAL DATA:  Dizziness. History of cancer.  EXAM: CT HEAD WITHOUT CONTRAST  TECHNIQUE: Contiguous axial images were obtained from the base of the skull through the vertex without contrast.  COMPARISON:  None  FINDINGS: No evidence for acute hemorrhage, mass lesion, midline shift, hydrocephalus or large infarct. There is subtle low-density in the parietal subcortical white matter which most likely represents chronic small vessel ischemic changes. No evidence for vasogenic edema. The visualized sinuses are clear. There may be a small osteoma in the right frontal sinus.  IMPRESSION: No acute intracranial abnormality.   Electronically Signed   By: Markus Daft M.D.   On: 01/26/2013 17:21   Ct Angio Chest Pe W/cm &/or Wo Cm  01/26/2013   CLINICAL DATA:  Shortness of breath. Near syncopal episode. Dizziness.  EXAM: CT ANGIOGRAPHY CHEST WITH CONTRAST  TECHNIQUE: Multidetector CT imaging of the chest was performed using the standard protocol during bolus administration of intravenous contrast. Multiplanar CT image reconstructions including MIPs were obtained to evaluate the vascular anatomy.  CONTRAST:  129m OMNIPAQUE IOHEXOL 350 MG/ML SOLN  COMPARISON:  None.  FINDINGS: Satisfactory opacification of pulmonary arteries noted, and no pulmonary emboli identified. No evidence of thoracic aortic dissection or aneurysm. No evidence of mediastinal hematoma or mass. Moderate cardiomegaly noted. No evidence of pleural or pericardial effusion.  A lobulated pulmonary nodule is seen in the left lower lobe on image 45 which measures 2.6 cm. This is consistent with a primary bronchogenic carcinoma. No other suspicious pulmonary nodules or masses are identified there is no evidence of hilar or mediastinal lymphadenopathy. No adenopathy seen elsewhere within the thorax. Both adrenal glands are normal in appearance. No suspicious bone lesions identified.  Review of the MIP images confirms the above findings.  IMPRESSION: No evidence of pulmonary embolism.  2.6 cm left lower lobe nodule, consistent with primary bronchogenic carcinoma. No evidence of thoracic metastatic disease. Consider PET-CT scan for preoperative staging evaluation.  Moderate cardiomegaly.   Electronically Signed   By: JEarle GellM.D.   On: 01/26/2013 18:33    Assessment/Plan Principal Problem:   Near syncope Active Problems:   Syncope   Lung nodule seen on imaging study   HTN (hypertension)   Diastolic dysfunction, left ventricle  Near-syncope Patient had near-syncope, did not have similar symptoms in the past. We'll admit the patient in telemetry , EKG shows ST-T changes in the anterior and lateral  leads. First set of troponin is negative in the ED, will obtain 3 sets of troponin every 6 hours. Will obtain cardiology consultation in the morning. We'll also obtain orthostatics every shift.  Lung nodule Patient had CT angiogram to rule out pulmonary embolism for  elevated d-dimer, CT is negative for PE but patient has 2.6 cm lung nodule in the left lower lung. PET/CT is recommended, patient will followup with her pulmonologist Dr. WMelvyn Novasas outpatient for further workup.  Hypertension Continue patient's antihypertensive medications including Coreg, Diovan. Will also start hydralazine 25 mg by mouth every 6 hours when necessary for BP more than 160/100.  Diastolic dysfunction Patient's echocardiogram done in October 2014 shows EF 40-45% and grade  1 diastolic dysfunction. Patient is currently on Lasix and appears well compensated. We'll continue Lasix 20 mg by mouth daily. Her BNP is mildly elevated to 621.9   Code status: patient is DO NOT RESUSCITATE   Family discussion: discussed with patient's daughter at bedside    Time Spent on Admission: 53 min  [facility] Pager: [phone] 01/26/2013, 8:32 PM  If 7PM-7AM, please contact night-coverage  [email or web address]  Password [email or web address]

## 2013-01-26 NOTE — ED Notes (Signed)
Patient given supper tray.  

## 2013-01-26 NOTE — ED Notes (Signed)
States she has an episode of dizziness and felt hot while shopping, states she was short of breath and her vision was blurred, states her vision is clearing and she is not longer hot and not as dizzy now

## 2013-01-27 DIAGNOSIS — I519 Heart disease, unspecified: Secondary | ICD-10-CM

## 2013-01-27 DIAGNOSIS — I5042 Chronic combined systolic (congestive) and diastolic (congestive) heart failure: Secondary | ICD-10-CM

## 2013-01-27 DIAGNOSIS — R911 Solitary pulmonary nodule: Secondary | ICD-10-CM

## 2013-01-27 DIAGNOSIS — R55 Syncope and collapse: Secondary | ICD-10-CM

## 2013-01-27 DIAGNOSIS — J449 Chronic obstructive pulmonary disease, unspecified: Secondary | ICD-10-CM

## 2013-01-27 DIAGNOSIS — I10 Essential (primary) hypertension: Secondary | ICD-10-CM

## 2013-01-27 LAB — CBC
HEMATOCRIT: 32.2 % — AB (ref 36.0–46.0)
HEMOGLOBIN: 10.5 g/dL — AB (ref 12.0–15.0)
MCH: 30.3 pg (ref 26.0–34.0)
MCHC: 32.6 g/dL (ref 30.0–36.0)
MCV: 93.1 fL (ref 78.0–100.0)
Platelets: 298 10*3/uL (ref 150–400)
RBC: 3.46 MIL/uL — AB (ref 3.87–5.11)
RDW: 13.5 % (ref 11.5–15.5)
WBC: 8.4 10*3/uL (ref 4.0–10.5)

## 2013-01-27 LAB — COMPREHENSIVE METABOLIC PANEL
ALK PHOS: 75 U/L (ref 39–117)
ALT: 25 U/L (ref 0–35)
AST: 22 U/L (ref 0–37)
Albumin: 3.3 g/dL — ABNORMAL LOW (ref 3.5–5.2)
BILIRUBIN TOTAL: 0.2 mg/dL — AB (ref 0.3–1.2)
BUN: 21 mg/dL (ref 6–23)
CHLORIDE: 103 meq/L (ref 96–112)
CO2: 24 mEq/L (ref 19–32)
Calcium: 8.7 mg/dL (ref 8.4–10.5)
Creatinine, Ser: 0.86 mg/dL (ref 0.50–1.10)
GFR calc non Af Amer: 62 mL/min — ABNORMAL LOW (ref >90)
GFR, EST AFRICAN AMERICAN: 72 mL/min — AB (ref >90)
GLUCOSE: 125 mg/dL — AB (ref 70–99)
POTASSIUM: 3.9 meq/L (ref 3.7–5.3)
Sodium: 140 mEq/L (ref 137–147)
Total Protein: 6.7 g/dL (ref 6.0–8.3)

## 2013-01-27 LAB — TROPONIN I
Troponin I: <0.3 ng/mL (ref <0.30)
Troponin I: <0.3 ng/mL (ref <0.30)

## 2013-01-27 MED ORDER — PNEUMOCOCCAL VAC POLYVALENT 25 MCG/0.5ML IJ INJ
0.5000 mL | INJECTION | INTRAMUSCULAR | Status: AC
  Administered 2013-01-28: 0.5 mL via INTRAMUSCULAR
  Filled 2013-01-27: qty 0.5

## 2013-01-27 NOTE — Care Management Note (Signed)
    Page 1 of 1   01/27/2013     2:13:01 PM   CARE MANAGEMENT NOTE 01/27/2013  Patient:  Alicia Davidson, Alicia Davidson   Account Number:  [id number]  Date Initiated:  01/27/2013  Documentation initiated by:  Claretha Cooper  Subjective/Objective Assessment:   Pt lives at home with her daughter. Spoke with pt and daughter at bedside. No HH/DME needs identified at this time. Plan to DC back home.     Action/Plan:   Anticipated DC Date:  01/28/2013   Anticipated DC Plan:  [place]  CM consult      Choice offered to / List presented to:             Status of service:  Completed, signed off Medicare Important Message given?   (If response is "NO", the following Medicare IM given date fields will be blank) Date Medicare IM given:   Date Additional Medicare IM given:    Discharge Disposition:    Per UR Regulation:    If discussed at Long Length of Stay Meetings, dates discussed:    Comments:  01/27/13 Claretha Cooper RN BSN CM

## 2013-01-27 NOTE — Progress Notes (Signed)
Utilization Review Complete  

## 2013-01-27 NOTE — Progress Notes (Signed)
[facility] PROGRESS NOTE  Alicia Davidson CBJ:[id number] DOB: 1930-10-10 DOA: 01/26/2013 PCP: [name], MD bRIEF HPI 80 year old female who has a past medical history of COPD (chronic obstructive pulmonary disease); Essential hypertension, benign; Hypercholesteremia; Osteoporosis; B12 deficiency; Anemia associated with acute blood loss; Gastric ulcer with hemorrhage but without obstruction; Weight loss, non-intentional; Anxiety; Umbilical hernia; Gastritis; Chronic diastolic heart failure; Cancer; and CHF (congestive heart failure).  Today presented to the ED after patient had an episode of diaphoresis and weakness, as per patient she was out today for shopping when suddenly she started feeling sweaty and dizzy with blurred vision but did not pass out. Patient says that it lasted for about one hour. Patient did not have chest pain but complained of chest tightness she denies shortness of breath. She denies nausea vomiting or diarrhea. She denies fever no dysuria urgency or frequency of urination  Assessment/Plan: Near-syncope  Patient had near-syncope, did not have similar symptoms in the past. She was admitted to telemetry, EKG shows ST-T changes in the anterior and lateral leads. Serial troponins negative.   cardiology consultation called.  Her orthostatics positive. Will repeat in am.    Lung nodule  Patient had CT angiogram to rule out pulmonary embolism for elevated d-dimer, CT is negative for PE but patient has 2.6 cm lung nodule in the left lower lung. PET/CT is recommended,. Dr Luan Pulling will see pt in am.   Hypertension  Continue patient's antihypertensive medications including Coreg, Diovan. Will also start hydralazine 25 mg by mouth every 6 hours when necessary for BP more than 160/100.   Diastolic dysfunction  Patient's echocardiogram done in October 2014 shows EF 40-45% and grade 1 diastolic dysfunction. Patient is currently on Lasix and appears well compensated. We'll  continue Lasix 20 mg by mouth daily. Her BNP is mildly elevated to 621.9      Code Status: full code Family Communication: discussed the plan of care with the daughter.  Disposition Plan: pending.    Consultants:  Cardiology  Pulmonary.  Procedures:  CT angio  Antibiotics:  none  HPI/Subjective: Comfortable.   Objective: Filed Vitals:   01/27/13 1409  BP: 134/70  Pulse: 79  Temp:   Resp: 20    Intake/Output Summary (Last 24 hours) at 01/27/13 1536 Last data filed at 01/27/13 1412  Gross per 24 hour  Intake    363 ml  Output    750 ml  Net   -387 ml   Filed Weights   01/26/13 1539 01/26/13 2156  Weight: 63.163 kg (139 lb 4 oz) 63.4 kg (139 lb 12.4 oz)    Exam:   General:  Alert afebrile, comfortable  Cardiovascular: s1s2  Respiratory: ctab  Abdomen: soft nt nd bs+  Musculoskeletal: no pedal edema  Data Reviewed: Basic Metabolic Panel:  Recent Labs Lab 01/26/13 1625 01/27/13 0540  NA 140 140  K 4.3 3.9  CL 100 103  CO2 29 24  GLUCOSE 120* 125*  BUN 27* 21  CREATININE 1.11* 0.86  CALCIUM 9.6 8.7   Liver Function Tests:  Recent Labs Lab 01/26/13 1625 01/27/13 0540  AST 24 22  ALT 28 25  ALKPHOS 82 75  BILITOT 0.2* 0.2*  PROT 7.3 6.7  ALBUMIN 3.6 3.3*   No results found for this basename: LIPASE, AMYLASE,  in the last 168 hours No results found for this basename: AMMONIA,  in the last 168 hours CBC:  Recent Labs Lab 01/26/13 1625 01/27/13 0540  WBC 8.7 8.4  NEUTROABS 6.2  --  HGB 11.6* 10.5*  HCT 35.7* 32.2*  MCV 93.7 93.1  PLT 325 298   Cardiac Enzymes:  Recent Labs Lab 01/26/13 1625 01/26/13 1832 01/26/13 2352 01/27/13 0540  TROPONINI <0.30 <0.30 <0.30 <0.30   BNP (last 3 results)  Recent Labs  01/26/13 1625  PROBNP 621.9*   CBG: No results found for this basename: GLUCAP,  in the last 168 hours  No results found for this or any previous visit (from the past 240 hour(s)).   Studies: Dg Chest  2 View  01/26/2013   CLINICAL DATA:  Near-syncope  EXAM: CHEST  2 VIEW  COMPARISON:  01/09/2012  FINDINGS: Heart is mildly enlarged. Interstitial prominence of bronchitic changes are stable. Increased AP diameter of the chest is a feature of COPD. Stable thoracic spine.  IMPRESSION: Cardiomegaly without decompensation.  Chronic changes.   Electronically Signed   By: Maryclare Bean M.D.   On: 01/26/2013 17:07   Ct Head Wo Contrast  01/26/2013   CLINICAL DATA:  Dizziness. History of cancer.  EXAM: CT HEAD WITHOUT CONTRAST  TECHNIQUE: Contiguous axial images were obtained from the base of the skull through the vertex without contrast.  COMPARISON:  None  FINDINGS: No evidence for acute hemorrhage, mass lesion, midline shift, hydrocephalus or large infarct. There is subtle low-density in the parietal subcortical white matter which most likely represents chronic small vessel ischemic changes. No evidence for vasogenic edema. The visualized sinuses are clear. There may be a small osteoma in the right frontal sinus.  IMPRESSION: No acute intracranial abnormality.   Electronically Signed   By: Markus Daft M.D.   On: 01/26/2013 17:21   Ct Angio Chest Pe W/cm &/or Wo Cm  01/26/2013   CLINICAL DATA:  Shortness of breath. Near syncopal episode. Dizziness.  EXAM: CT ANGIOGRAPHY CHEST WITH CONTRAST  TECHNIQUE: Multidetector CT imaging of the chest was performed using the standard protocol during bolus administration of intravenous contrast. Multiplanar CT image reconstructions including MIPs were obtained to evaluate the vascular anatomy.  CONTRAST:  185mL OMNIPAQUE IOHEXOL 350 MG/ML SOLN  COMPARISON:  None.  FINDINGS: Satisfactory opacification of pulmonary arteries noted, and no pulmonary emboli identified. No evidence of thoracic aortic dissection or aneurysm. No evidence of mediastinal hematoma or mass. Moderate cardiomegaly noted. No evidence of pleural or pericardial effusion.  A lobulated pulmonary nodule is seen in the  left lower lobe on image 45 which measures 2.6 cm. This is consistent with a primary bronchogenic carcinoma. No other suspicious pulmonary nodules or masses are identified there is no evidence of hilar or mediastinal lymphadenopathy. No adenopathy seen elsewhere within the thorax. Both adrenal glands are normal in appearance. No suspicious bone lesions identified.  Review of the MIP images confirms the above findings.  IMPRESSION: No evidence of pulmonary embolism.  2.6 cm left lower lobe nodule, consistent with primary bronchogenic carcinoma. No evidence of thoracic metastatic disease. Consider PET-CT scan for preoperative staging evaluation.  Moderate cardiomegaly.   Electronically Signed   By: Earle Gell M.D.   On: 01/26/2013 18:33    Scheduled Meds: . aspirin  81 mg Oral Daily  . atorvastatin  20 mg Oral QHS  . carvedilol  6.25 mg Oral BID WC  . digoxin  0.125 mg Oral Daily  . enoxaparin (LOVENOX) injection  40 mg Subcutaneous Q24H  . furosemide  20 mg Oral Daily  . irbesartan  150 mg Oral Daily  . [START ON 01/28/2013] pneumococcal 23 valent vaccine  0.5 mL Intramuscular Tomorrow-1000  .  potassium chloride SA  20 mEq Oral Daily  . sodium chloride  3 mL Intravenous Q12H   Continuous Infusions:   Principal Problem:   Near syncope Active Problems:   Syncope   Lung nodule seen on imaging study   HTN (hypertension)   Diastolic dysfunction, left ventricle    Time spent: 25 min   Oberia Beaudoin  [facility] Pager [phone] If 7PM-7AM, please contact night-coverage at [email or web address], password [facility] 01/27/2013, 3:36 PM  LOS: 1 day

## 2013-01-28 ENCOUNTER — Other Ambulatory Visit: Payer: Self-pay | Admitting: *Deleted

## 2013-01-28 ENCOUNTER — Encounter (HOSPITAL_COMMUNITY): Payer: Self-pay | Admitting: Cardiology

## 2013-01-28 DIAGNOSIS — I5043 Acute on chronic combined systolic (congestive) and diastolic (congestive) heart failure: Secondary | ICD-10-CM

## 2013-01-28 DIAGNOSIS — I10 Essential (primary) hypertension: Secondary | ICD-10-CM

## 2013-01-28 DIAGNOSIS — R0989 Other specified symptoms and signs involving the circulatory and respiratory systems: Secondary | ICD-10-CM

## 2013-01-28 MED ORDER — HYDRALAZINE HCL 20 MG/ML IJ SOLN: 5.0000 mg | Freq: Four times a day (QID) | INTRAMUSCULAR | Status: DC | PRN

## 2013-01-28 NOTE — Progress Notes (Signed)
[facility] PROGRESS NOTE  Alicia Davidson KGU:[id number] DOB: 1930/06/30 DOA: 01/26/2013 PCP: [name], MD bRIEF HPI 80 year old female who has a past medical history of COPD (chronic obstructive pulmonary disease); Essential hypertension, benign; Hypercholesteremia; Osteoporosis; B12 deficiency; Anemia associated with acute blood loss; Gastric ulcer with hemorrhage but without obstruction; Weight loss, non-intentional; Anxiety; Umbilical hernia; Gastritis; Chronic diastolic heart failure; Cancer; and CHF (congestive heart failure).  Today presented to the ED after patient had an episode of diaphoresis and weakness, as per patient she was out today for shopping when suddenly she started feeling sweaty and dizzy with blurred vision but did not pass out. Patient says that it lasted for about one hour. Patient did not have chest pain but complained of chest tightness she denies shortness of breath. She denies nausea vomiting or diarrhea. She denies fever no dysuria urgency or frequency of urination  Assessment/Plan: Near-syncope  Patient had near-syncope, did not have similar symptoms in the past. She was admitted to telemetry, EKG shows ST-T changes in the anterior and lateral leads. Serial troponins negative.   cardiology consultation called recommended outpatient work up with carotid dopplers,  Possible cardiac monitor if symptoms persist Her orthostatics negative this am   Lung nodule  Patient had CT angiogram to rule out pulmonary embolism for elevated d-dimer, CT is negative for PE but patient has 2.6 cm lung nodule in the left lower lung. PET/CT is recommended,. Dr Luan Pulling will see pt today in consultation. We will arrange PET scan as outpatient.   Hypertension  Continue patient's antihypertensive medications including Coreg, Diovan. Will also start hydralazine 25 mg by mouth every 6 hours when necessary for BP more than 160/100.   Diastolic dysfunction  Patient's echocardiogram done  in October 2014 shows EF 40-45% and grade 1 diastolic dysfunction. Patient is currently on Lasix and appears well compensated. We'll continue Lasix 20 mg by mouth daily. Her BNP is mildly elevated to 621.9   DVT prophylaxis.   Code Status: full code Family Communication: discussed the plan of care with the daughter on 1/21.  Disposition Plan: pending, possibly today.    Consultants:  Cardiology  Pulmonary.  Procedures:  CT angio  Antibiotics:  none  HPI/Subjective: Comfortable. Denies any new complaints. Waiting PT eval .  Objective: Filed Vitals:   01/28/13 0408  BP: 193/76  Pulse: 64  Temp: 98.1 F (36.7 C)  Resp: 19    Intake/Output Summary (Last 24 hours) at 01/28/13 1016 Last data filed at 01/28/13 0900  Gross per 24 hour  Intake    480 ml  Output    400 ml  Net     80 ml   Filed Weights   01/26/13 1539 01/26/13 2156  Weight: 63.163 kg (139 lb 4 oz) 63.4 kg (139 lb 12.4 oz)    Exam:   General:  Alert afebrile, comfortable  Cardiovascular: s1s2  Respiratory: ctab  Abdomen: soft nt nd bs+  Musculoskeletal: no pedal edema  Data Reviewed: Basic Metabolic Panel:  Recent Labs Lab 01/26/13 1625 01/27/13 0540  NA 140 140  K 4.3 3.9  CL 100 103  CO2 29 24  GLUCOSE 120* 125*  BUN 27* 21  CREATININE 1.11* 0.86  CALCIUM 9.6 8.7   Liver Function Tests:  Recent Labs Lab 01/26/13 1625 01/27/13 0540  AST 24 22  ALT 28 25  ALKPHOS 82 75  BILITOT 0.2* 0.2*  PROT 7.3 6.7  ALBUMIN 3.6 3.3*   No results found for this basename: LIPASE, AMYLASE,  in the last 168 hours No results found for this basename: AMMONIA,  in the last 168 hours CBC:  Recent Labs Lab 01/26/13 1625 01/27/13 0540  WBC 8.7 8.4  NEUTROABS 6.2  --   HGB 11.6* 10.5*  HCT 35.7* 32.2*  MCV 93.7 93.1  PLT 325 298   Cardiac Enzymes:  Recent Labs Lab 01/26/13 1625 01/26/13 1832 01/26/13 2352 01/27/13 0540  TROPONINI <0.30 <0.30 <0.30 <0.30   BNP (last 3  results)  Recent Labs  01/26/13 1625  PROBNP 621.9*   CBG: No results found for this basename: GLUCAP,  in the last 168 hours  No results found for this or any previous visit (from the past 240 hour(s)).   Studies: Dg Chest 2 View  01/26/2013   CLINICAL DATA:  Near-syncope  EXAM: CHEST  2 VIEW  COMPARISON:  01/09/2012  FINDINGS: Heart is mildly enlarged. Interstitial prominence of bronchitic changes are stable. Increased AP diameter of the chest is a feature of COPD. Stable thoracic spine.  IMPRESSION: Cardiomegaly without decompensation.  Chronic changes.   Electronically Signed   By: Maryclare Bean M.D.   On: 01/26/2013 17:07   Ct Head Wo Contrast  01/26/2013   CLINICAL DATA:  Dizziness. History of cancer.  EXAM: CT HEAD WITHOUT CONTRAST  TECHNIQUE: Contiguous axial images were obtained from the base of the skull through the vertex without contrast.  COMPARISON:  None  FINDINGS: No evidence for acute hemorrhage, mass lesion, midline shift, hydrocephalus or large infarct. There is subtle low-density in the parietal subcortical white matter which most likely represents chronic small vessel ischemic changes. No evidence for vasogenic edema. The visualized sinuses are clear. There may be a small osteoma in the right frontal sinus.  IMPRESSION: No acute intracranial abnormality.   Electronically Signed   By: Markus Daft M.D.   On: 01/26/2013 17:21   Ct Angio Chest Pe W/cm &/or Wo Cm  01/26/2013   CLINICAL DATA:  Shortness of breath. Near syncopal episode. Dizziness.  EXAM: CT ANGIOGRAPHY CHEST WITH CONTRAST  TECHNIQUE: Multidetector CT imaging of the chest was performed using the standard protocol during bolus administration of intravenous contrast. Multiplanar CT image reconstructions including MIPs were obtained to evaluate the vascular anatomy.  CONTRAST:  1109mL OMNIPAQUE IOHEXOL 350 MG/ML SOLN  COMPARISON:  None.  FINDINGS: Satisfactory opacification of pulmonary arteries noted, and no pulmonary  emboli identified. No evidence of thoracic aortic dissection or aneurysm. No evidence of mediastinal hematoma or mass. Moderate cardiomegaly noted. No evidence of pleural or pericardial effusion.  A lobulated pulmonary nodule is seen in the left lower lobe on image 45 which measures 2.6 cm. This is consistent with a primary bronchogenic carcinoma. No other suspicious pulmonary nodules or masses are identified there is no evidence of hilar or mediastinal lymphadenopathy. No adenopathy seen elsewhere within the thorax. Both adrenal glands are normal in appearance. No suspicious bone lesions identified.  Review of the MIP images confirms the above findings.  IMPRESSION: No evidence of pulmonary embolism.  2.6 cm left lower lobe nodule, consistent with primary bronchogenic carcinoma. No evidence of thoracic metastatic disease. Consider PET-CT scan for preoperative staging evaluation.  Moderate cardiomegaly.   Electronically Signed   By: Earle Gell M.D.   On: 01/26/2013 18:33    Scheduled Meds: . aspirin  81 mg Oral Daily  . atorvastatin  20 mg Oral QHS  . carvedilol  6.25 mg Oral BID WC  . digoxin  0.125 mg Oral Daily  .  enoxaparin (LOVENOX) injection  40 mg Subcutaneous Q24H  . furosemide  20 mg Oral Daily  . irbesartan  150 mg Oral Daily  . potassium chloride SA  20 mEq Oral Daily  . sodium chloride  3 mL Intravenous Q12H   Continuous Infusions:   Principal Problem:   Near syncope Active Problems:   Syncope   Lung nodule seen on imaging study   HTN (hypertension)   Diastolic dysfunction, left ventricle    Time spent: 25 min   Alicia Davidson  [facility] Pager [phone] If 7PM-7AM, please contact night-coverage at [email or web address], password [facility] 01/28/2013, 10:16 AM  LOS: 2 days

## 2013-01-28 NOTE — Progress Notes (Signed)
Patient discharged home with family.  IV removed - WNL.  Follow up with Luan Pulling and Cardio in place.  Also scheduled for carotid dopplers next week.  No changes to meds done, instructed to resume home meds.  Instructed patient to change positions slowly to prevent dizzy spells and to drink plenty of fluids.  Patient and family have no questions at this time.  Verbalize understanding of DC instructions.  Stable to DC home, left floor via WC with NT.

## 2013-01-28 NOTE — Discharge Summary (Signed)
Physician Discharge Summary  Alicia Davidson CBJ:[id number] DOB: Aug 25, 1930 DOA: 01/26/2013  PCP: [name], MD  Admit date: 01/26/2013 Discharge date: 01/28/2013  Time spent: 30 minutes  Recommendations for Outpatient Follow-up:  1. Follow up with PCP in one week 2. Follow upiwth Dr Luan Pulling as recommended 3. Follow up withPET SCAN.  Discharge Diagnoses:  Principal Problem:   Near syncope Active Problems:   Syncope   Lung nodule seen on imaging study   HTN (hypertension)   Diastolic dysfunction, left ventricle   Discharge Condition: imrpoved.   Diet recommendation: LOW SODIUM DIET  Filed Weights   01/26/13 1539 01/26/13 2156  Weight: 63.163 kg (139 lb 4 oz) 63.4 kg (139 lb 12.4 oz)    History of present illness:  80 year old female who has a past medical history of COPD (chronic obstructive pulmonary disease); Essential hypertension, benign; Hypercholesteremia; Osteoporosis; B12 deficiency; Anemia associated with acute blood loss; Gastric ulcer with hemorrhage but without obstruction; Weight loss, non-intentional; Anxiety; Umbilical hernia; Gastritis; Chronic diastolic heart failure; Cancer; and CHF (congestive heart failure).  Today presented to the ED after patient had an episode of diaphoresis and weakness, as per patient she was out today for shopping when suddenly she started feeling sweaty and dizzy with blurred vision but did not pass out. Patient says that it lasted for about one hour. Patient did not have chest pain but complained of chest tightness she denies shortness of breath. She denies nausea vomiting or diarrhea. She denies fever no dysuria urgency or frequency of urination   Hospital Course:  Near-syncope  Patient had near-syncope, did not have similar symptoms in the past. She was admitted to telemetry, EKG shows ST-T changes in the anterior and lateral leads. Serial troponins negative.  cardiology consultation called recommended outpatient work up with carotid  dopplers, Possible cardiac monitor if symptoms persist  Her orthostatics negative this am  Lung nodule  Patient had CT angiogram to rule out pulmonary embolism for elevated d-dimer, CT is negative for PE but patient has 2.6 cm lung nodule in the left lower lung. PET/CT is recommended,.  Dr Luan Pulling will see pt today in consultation. We will arrange PET scan as outpatient.  Hypertension  Continue patient's antihypertensive medications including Coreg, Diovan. Will also start hydralazine 25 mg by mouth every 6 hours when necessary for BP more than 160/100.  Diastolic dysfunction  Patient's echocardiogram done in October 2014 shows EF 40-45% and grade 1 diastolic dysfunction. Patient is currently on Lasix and appears well compensated. We'll continue Lasix 20 mg by mouth daily. Her BNP is mildly elevated to 621.9    Procedures:  CT ANGIO  Consultations:  pulm  cardiology  Discharge Exam: Filed Vitals:   01/28/13 0408  BP: 193/76  Pulse: 64  Temp: 98.1 F (36.7 C)  Resp: 19   General: Alert afebrile, comfortable  Cardiovascular: s1s2  Respiratory: ctab  Abdomen: soft nt nd bs+  Musculoskeletal: no pedal edema  Discharge Instructions  Discharge Orders   Future Appointments Provider Department Dept Phone   02/03/2013 1:15 PM [facility] [place] [facility] [phone]   02/18/2013 1:10 PM Lendon Colonel, NP [facility] [place] [phone]   Future Orders Complete By Expires   Discharge instructions  As directed    Comments:     Follow up with Dr Luan Pulling as recommended. You will need to get a PET scan to check the 2.6 cm lung nodule.       Medication List  aspirin 81 MG tablet  Take 81 mg by mouth daily.     atorvastatin 20 MG tablet  Commonly known as:  LIPITOR  Take 20 mg by mouth at bedtime.     carvedilol 6.25 MG tablet  Commonly known as:  COREG  Take 1 tablet (6.25 mg total) by mouth 2 (two) times daily.     CENTRUM SILVER tablet  Take 1  tablet by mouth daily.     digoxin 0.125 MG tablet  Commonly known as:  LANOXIN  Take 1 tablet (0.125 mg total) by mouth daily.     FOLIC ACID PO  Take 875 mcg by mouth daily.     furosemide 20 MG tablet  Commonly known as:  LASIX  Take 20 mg by mouth daily.     ipratropium-albuterol 0.5-2.5 (3) MG/3ML Soln  Commonly known as:  DUONEB  Take 3 mLs by nebulization every 6 (six) hours as needed (shortness of breath).     Iron Tabs  Take 1 tablet by mouth 2 (two) times daily.     LORazepam 0.5 MG tablet  Commonly known as:  ATIVAN  Take 0.5 mg by mouth daily as needed.     omeprazole 20 MG tablet  Commonly known as:  PRILOSEC OTC  Take 40 mg by mouth daily.     potassium chloride SA 20 MEQ tablet  Commonly known as:  K-DUR,KLOR-CON  Take 1 tablet (20 mEq total) by mouth daily.     valsartan 160 MG tablet  Commonly known as:  DIOVAN  Take 160 mg by mouth daily.     VITA-C PO  Take 1 tablet by mouth 2 (two) times daily.     XOPENEX HFA 45 MCG/ACT inhaler  Generic drug:  levalbuterol  Inhale 2 puffs into the lungs every 6 (six) hours as needed.       No Known Allergies     Follow-up Information   Follow up with [facility] On 02/03/2013. (register at 1 pm.  1st floor at [facility])       Follow up with Jory Sims, NP On 01/28/2013. (1:10 pm)    Specialty:  Nurse Practitioner   Contact information:   [place] [address] [phone]        The results of significant diagnostics from this hospitalization (including imaging, microbiology, ancillary and laboratory) are listed below for reference.    Significant Diagnostic Studies: Dg Chest 2 View  01/26/2013   CLINICAL DATA:  Near-syncope  EXAM: CHEST  2 VIEW  COMPARISON:  01/09/2012  FINDINGS: Heart is mildly enlarged. Interstitial prominence of bronchitic changes are stable. Increased AP diameter of the chest is a feature of COPD. Stable thoracic spine.  IMPRESSION: Cardiomegaly  without decompensation.  Chronic changes.   Electronically Signed   By: Maryclare Bean M.D.   On: 01/26/2013 17:07   Ct Head Wo Contrast  01/26/2013   CLINICAL DATA:  Dizziness. History of cancer.  EXAM: CT HEAD WITHOUT CONTRAST  TECHNIQUE: Contiguous axial images were obtained from the base of the skull through the vertex without contrast.  COMPARISON:  None  FINDINGS: No evidence for acute hemorrhage, mass lesion, midline shift, hydrocephalus or large infarct. There is subtle low-density in the parietal subcortical white matter which most likely represents chronic small vessel ischemic changes. No evidence for vasogenic edema. The visualized sinuses are clear. There may be a small osteoma in the right frontal sinus.  IMPRESSION: No acute intracranial abnormality.  Electronically Signed   By: Markus Daft M.D.   On: 01/26/2013 17:21   Ct Angio Chest Pe W/cm &/or Wo Cm  01/26/2013   CLINICAL DATA:  Shortness of breath. Near syncopal episode. Dizziness.  EXAM: CT ANGIOGRAPHY CHEST WITH CONTRAST  TECHNIQUE: Multidetector CT imaging of the chest was performed using the standard protocol during bolus administration of intravenous contrast. Multiplanar CT image reconstructions including MIPs were obtained to evaluate the vascular anatomy.  CONTRAST:  140mL OMNIPAQUE IOHEXOL 350 MG/ML SOLN  COMPARISON:  None.  FINDINGS: Satisfactory opacification of pulmonary arteries noted, and no pulmonary emboli identified. No evidence of thoracic aortic dissection or aneurysm. No evidence of mediastinal hematoma or mass. Moderate cardiomegaly noted. No evidence of pleural or pericardial effusion.  A lobulated pulmonary nodule is seen in the left lower lobe on image 45 which measures 2.6 cm. This is consistent with a primary bronchogenic carcinoma. No other suspicious pulmonary nodules or masses are identified there is no evidence of hilar or mediastinal lymphadenopathy. No adenopathy seen elsewhere within the thorax. Both adrenal  glands are normal in appearance. No suspicious bone lesions identified.  Review of the MIP images confirms the above findings.  IMPRESSION: No evidence of pulmonary embolism.  2.6 cm left lower lobe nodule, consistent with primary bronchogenic carcinoma. No evidence of thoracic metastatic disease. Consider PET-CT scan for preoperative staging evaluation.  Moderate cardiomegaly.   Electronically Signed   By: Earle Gell M.D.   On: 01/26/2013 18:33    Microbiology: No results found for this or any previous visit (from the past 240 hour(s)).   Labs: Basic Metabolic Panel:  Recent Labs Lab 01/26/13 1625 01/27/13 0540  NA 140 140  K 4.3 3.9  CL 100 103  CO2 29 24  GLUCOSE 120* 125*  BUN 27* 21  CREATININE 1.11* 0.86  CALCIUM 9.6 8.7   Liver Function Tests:  Recent Labs Lab 01/26/13 1625 01/27/13 0540  AST 24 22  ALT 28 25  ALKPHOS 82 75  BILITOT 0.2* 0.2*  PROT 7.3 6.7  ALBUMIN 3.6 3.3*   No results found for this basename: LIPASE, AMYLASE,  in the last 168 hours No results found for this basename: AMMONIA,  in the last 168 hours CBC:  Recent Labs Lab 01/26/13 1625 01/27/13 0540  WBC 8.7 8.4  NEUTROABS 6.2  --   HGB 11.6* 10.5*  HCT 35.7* 32.2*  MCV 93.7 93.1  PLT 325 298   Cardiac Enzymes:  Recent Labs Lab 01/26/13 1625 01/26/13 1832 01/26/13 2352 01/27/13 0540  TROPONINI <0.30 <0.30 <0.30 <0.30   BNP: BNP (last 3 results)  Recent Labs  01/26/13 1625  PROBNP 621.9*   CBG: No results found for this basename: GLUCAP,  in the last 168 hours     Signed:  Breyonna Nault  [facility] 01/28/2013, 1:10 PM

## 2013-01-28 NOTE — Consult Note (Addendum)
CARDIOLOGY CONSULT NOTE   Patient ID: Alicia Davidson MRN: [id number] DOB/AGE: [birth year] 78 y.o.  Admit Date: 01/26/2013 Referring Physician: P[facility] Primary Physician: [name], MD Consulting Cardiologist: Rozann Lesches MD Primary Cardiologist: Rozann Lesches MD Reason for Consultation: Pre-Syncope  Clinical Summary Alicia Davidson is an 80 y.o.female admitted with pre-syncopal episode associated with diaphoresis.and weakness.  She has a  known history of systolic dysfunction most recent EF in Oct of 2014 of 82-70%, grade I diastolic dysfunction, AoV disease with mildly calcified annulus without stenosis, hypertension, COPD with chronic dyspnea, anemia with GIB.Marland Kitchen     She states she was out shopping when she has sudden onset of diaphoresis and flushing. She then began to feel dizzy and lightheaded, followed by diplopia. States "it came in stages." She sat down to prevent herself from falling. Due to ongoing symptoms, her daughter, who was with her, brought her to ER. She denied chest pain, but did feel some tightness across her chest with diaphoresis. Symptoms last approx 30 minutes.      On arrival to ER,  BP was 104/43, H/H 11.5/35.7. BUN 27, Creat 1.11. Pro-BNP 621. D-Dimer elevated with CT negative for PE.  It was noted, however, that she had a 2.6cm LLL nodule consistent with bronchogenic carcinoma, without evidence of metastatic disease. Cardiac enzymes were negative X 3. She was given IV fluid bolus, and one dose of NTG SL.     She was not found to be orthostatic, in fact became slightly hypertensive and is not been placed on hydralazine prn, since IV fluid hydration. She denies sick contacts, or NV, diarrhea prior to event. Now feeling much better but is weak from being on bedrest.         No Known Allergies  Medications Scheduled Medications: . aspirin  81 mg Oral Daily  . atorvastatin  20 mg Oral QHS  . carvedilol  6.25 mg Oral BID WC  . digoxin  0.125 mg Oral Daily  .  enoxaparin (LOVENOX) injection  40 mg Subcutaneous Q24H  . furosemide  20 mg Oral Daily  . irbesartan  150 mg Oral Daily  . [COMPLETED] pneumococcal 23 valent vaccine  0.5 mL Intramuscular Tomorrow-1000  . potassium chloride SA  20 mEq Oral Daily  . sodium chloride  3 mL Intravenous Q12H    PRN Medications: sodium chloride, hydrALAZINE, hydrALAZINE, HYDROcodone-acetaminophen, ipratropium-albuterol, LORazepam, ondansetron (ZOFRAN) IV, ondansetron, sodium chloride   Past Medical History  Diagnosis Date  . COPD (chronic obstructive pulmonary disease)   . Essential hypertension, benign   . Hypercholesteremia   . Osteoporosis   . B12 deficiency   . Anemia associated with acute blood loss   . Gastric ulcer with hemorrhage but without obstruction   . Weight loss, non-intentional   . Anxiety   . Umbilical hernia   . Gastritis     Mild  . Chronic systolic heart failure     Diagnosed [place] March 2014  . Cancer   . Cardiomyopathy     Probable nonischemic, LVEF 40-45%    History reviewed. No pertinent past surgical history.  Family History  Problem Relation Age of Onset  . Heart disease Father   . Colon cancer Sister   . Kidney failure Brother   . Other Mother     Bright's disease    Social History Alicia Davidson reports that she quit smoking about 12 months ago. Her smoking use included Cigarettes. She has a 16.8 pack-year smoking history. She has never used smokeless  tobacco. Alicia Davidson reports that she does not drink alcohol.  Review of Systems Otherwise reviewed and negative except as outlined.  Physical Examination Blood pressure 193/76, pulse 64, temperature 98.1 F (36.7 C), temperature source Oral, resp. rate 19, height 4\' 11"  (1.499 m), weight 139 lb 12.4 oz (63.4 kg), SpO2 99.00%.  Intake/Output Summary (Last 24 hours) at 01/28/13 0933 Last data filed at 01/27/13 1759  Gross per 24 hour  Intake    360 ml  Output    400 ml  Net    -40 ml   No distress. HEENT:  Conjunctiva and lids normal, oropharynx clear with moist mucosa. Neck: Supple, no elevated JVP, bilateral carotid bruits, no thyromegaly. Lungs: Some bilateral crackles are noted without wheezes. Occasional coughing.  Cardiac: Regular rate and CBJSEG,3/1  systolic murmur, no pericardial rub. Abdomen: Soft, nontender, no hepatomegaly, bowel sounds present, no guarding or rebound. Extremities: No pitting edema, distal pulses 2+. Skin: Warm and dry. Musculoskeletal: No kyphosis. Neuropsychiatric: Alert and oriented x3, affect grossly appropriate.   Prior Cardiac Testing/Procedures 1.Echocardiogram: 10/16/2012 Left ventricle: The cavity size was normal. Wall thickness was increased increased in a pattern of mild to moderate LVH. Systolic function was mildly to moderately reduced. The estimated ejection fraction was in the range of 40% to 45%. Doppler parameters are consistent with abnormal left ventricular relaxation (grade 1 diastolic dysfunction). - Aortic valve: Mildly calcified annulus. Probably trileaflet; mildly thickened leaflets. Valve area: 1.6cm^2(VTI). Valve area: 1.72cm^2 (Vmax). - Mitral valve: Mildly calcified annulus. Mildly thickened leaflets . Mild regurgitation. - Left atrium: The atrium was severely dilated. - Right ventricle: Systolic function was mildly reduced. RV TAPSE is 1.4 cm. - Right atrium: The atrium was mildly dilated. - Pulmonary arteries: PA peak pressure: 47mm Hg (S).  2. Stress test 06/2012 Abnormal pharmacologic stress nuclear myocardial study revealing substantial left ventricular enlargement, moderate to severe global left ventricular dysfunction and normal myocardial perfusion. The stress EKG was nondiagnostic due to baseline ST-segment abnormalities.    Lab Results  Basic Metabolic Panel:  Recent Labs Lab 01/26/13 1625 01/27/13 0540  NA 140 140  K 4.3 3.9  CL 100 103  CO2 29 24  GLUCOSE 120* 125*  BUN 27* 21  CREATININE 1.11* 0.86    CALCIUM 9.6 8.7    Liver Function Tests:  Recent Labs Lab 01/26/13 1625 01/27/13 0540  AST 24 22  ALT 28 25  ALKPHOS 82 75  BILITOT 0.2* 0.2*  PROT 7.3 6.7  ALBUMIN 3.6 3.3*    CBC:  Recent Labs Lab 01/26/13 1625 01/27/13 0540  WBC 8.7 8.4  NEUTROABS 6.2  --   HGB 11.6* 10.5*  HCT 35.7* 32.2*  MCV 93.7 93.1  PLT 325 298    Cardiac Enzymes:  Recent Labs Lab 01/26/13 1625 01/26/13 1832 01/26/13 2352 01/27/13 0540  TROPONINI <0.30 <0.30 <0.30 <0.30    Radiology: Dg Chest 2 View  01/26/2013   CLINICAL DATA:  Near-syncope  EXAM: CHEST  2 VIEW  COMPARISON:  01/09/2012  FINDINGS: Heart is mildly enlarged. Interstitial prominence of bronchitic changes are stable. Increased AP diameter of the chest is a feature of COPD. Stable thoracic spine.  IMPRESSION: Cardiomegaly without decompensation.  Chronic changes.   Electronically Signed   By: Maryclare Bean M.D.   On: 01/26/2013 17:07   Ct Head Wo Contrast  01/26/2013   CLINICAL DATA:  Dizziness. History of cancer.  EXAM: CT HEAD WITHOUT CONTRAST  TECHNIQUE: Contiguous axial images were obtained from the base  of the skull through the vertex without contrast.  COMPARISON:  None  FINDINGS: No evidence for acute hemorrhage, mass lesion, midline shift, hydrocephalus or large infarct. There is subtle low-density in the parietal subcortical white matter which most likely represents chronic small vessel ischemic changes. No evidence for vasogenic edema. The visualized sinuses are clear. There may be a small osteoma in the right frontal sinus.  IMPRESSION: No acute intracranial abnormality.   Electronically Signed   By: Markus Daft M.D.   On: 01/26/2013 17:21   Ct Angio Chest Pe W/cm &/or Wo Cm  01/26/2013   CLINICAL DATA:  Shortness of breath. Near syncopal episode. Dizziness.  EXAM: CT ANGIOGRAPHY CHEST WITH CONTRAST  TECHNIQUE: Multidetector CT imaging of the chest IMPRESSION: No evidence of pulmonary embolism.  2.6 cm left lower lobe  nodule, consistent with primary bronchogenic carcinoma. No evidence of thoracic metastatic disease. Consider PET-CT scan for preoperative staging evaluation.  Moderate cardiomegaly.   Electronically Signed   By: Earle Gell M.D.   On: 01/26/2013 18:33   ECG: Sinus rhythm with IVCD and diffuse ST-T wave abnormalities, repolarization changes, cannot exclude ischemia.   Impression and Recommendations  1. Near Syncope: She has not had any episodes like this before. She is feeling much better now after hydration. She is on home dose of lasix 20 mg daily which has been continued on admission. She does not appear to be dehydrated. CT head, negative for intracranial abnormality. She denies palpitations or heart racing prior to or during event. She was hypotensive during ER evaluation.  Cardiac markers are negative arguing against ACS. She did have some chest tightness noted during the event. EKG shows more prominent T-wave inversion in V4-V6 and inferior leads compared to EKG in May of 2014.  2. Mixed systolic and diastolic dysfunction: Per recent echo in Oct of 2014, Grade I with EF of 45%.. Continue BP control medications and low dose lasix. May need to repeat limited echo for changes in LV fx.   3. Hypertension:  BP was low normal on admission, but has subsequently increased. She is also being given hydralazine prn. Continue carvedilol, ASA, Dig, and ARB.   4..Abnormal CT Chest: Lung nodule in the LLL concerning for CA, Will defer further work-up to [facility] and her primary care physician.   Signed: Phill Myron. Purcell Nails NP [facility] 01/28/2013, 9:33 AM Co-Sign MD   Attending note:  Patient seen and examined. I reviewed interval records since our last office visit and modified above note by Ms. Lawrence NP. Alicia Davidson after an episode of near syncope. States she was shopping, standing up, felt very "hot" followed by diaphoresis and a feeling of dizziness and visual dimming. She sat  own and did not actually pass out. Symptoms improved to baseline after about an hour. She didi not endorse to me any specific sense of chest pain or palpitations with this episode. No prior similar events. She reports compliance with her medications. She was not found to be orthostatic, cardiac markers argue against ACS. ECG reviewed with ST-T wave abnormalities as discussed, potentially repolarization changes, ischemia not excluded. She has a known cardiomyopathy with LVEF 40-45%, negative noninvasive ischemic workup, managed medically.  Situation was discussed with the patient and her son-in-law at bedside. She is eager to go home and states she feels better. Recommend continuing her current outpatient regimen. We will followup with carotid Dopplers given bilateral carotid bruits, although this is unlikely to be an explanation for her  recent event. We did discuss potential use of an outpatient cardiac monitor, particularly if symptoms persist. She preferred observation for now. We will schedule her a followup office visit.  Incidentally noted during her workup was a 2.6 cm left lower lobe nodule concerning for potential cancer. [facility] is arranging a PET scan. Patient will need to have close outpatient followup with her primary care physician and possibly oncology depending on the results.  Satira Sark, M.D., F.A.C.C.

## 2013-01-29 NOTE — Consult Note (Signed)
NAMEJEANNE, DIEFENDORF                 ACCOUNT NO.:  [id number]  MEDICAL RECORD NO.:  [id number]  LOCATION:  Z124                          FACILITY:  [facility]  PHYSICIAN:  Diedre Maclellan L. Luan Pulling, M.D.DATE OF BIRTH:  1930-01-16  DATE OF CONSULTATION: DATE OF DISCHARGE:  01/28/2013                                CONSULTATION   Alicia Davidson is a patient who came to the emergency department after having had an episode of diaphoresis and weakness.  She has been out shopping.  This lasted for approximately an hour.  She was evaluated in the emergency department and as part of her workup, she had CT angiogram of the chest and this shows that she has a new problem, which is a lung nodule.  This is a 2.6-cm spiculated nodule that is in the left lower lobe.  She says that she has had a previous history of COPD, and had seen Dr. Melvyn Novas in [place], but has not seen him in about a year and has done very well with her COPD.  She has a previous history of hypertension and left ventricular diastolic dysfunction.  SOCIAL HISTORY:  She had quit smoking about a year ago.  FAMILY HISTORY:  Positive for heart disease in her father and renal failure in her brother.  MEDICATIONS AT HOME: 1. Vitamins C 500 mg b.i.d. 2. Aspirin 81 mg daily. 3. Lipitor 20 mg at bedtime. 4. Carvedilol 6.25 mg b.i.d. 5. Digoxin 0.125 mg daily. 6. Folic acid 580 mcg daily. 7. Lasix 20 mg daily. 8. DuoNeb nebulizer 4 times a day. 9. Iron tablets twice a day. 10.Xopenex metered dose inhaler as needed. 11.Centrum Silver as needed. 12.Prilosec 40 mg daily. 13.Potassium chloride 20 mEq daily. 14.Valsartan 160 mg daily. 15.Lorazepam 0.5 mg daily as needed.  REVIEW OF SYSTEMS:  She says she has not had any chest pain, really no shortness of breath.  No cough, no congestion, no hemoptysis.  PHYSICAL EXAMINATION:  GENERAL:  She is awake and alert.  She is a well developed and well nourished.  She is alert and cooperative. HEENT:  Shows  that she has some missing teeth. NECK:  Supple without masses. CHEST:  Clear. HEART:  Regular without gallop. ABDOMEN:  Soft.  No masses are felt.  Bowel sounds are present and active. EXTREMITIES:  No edema. CENTRAL NERVOUS SYSTEM:  Grossly intact.  ASSESSMENT:  She has a pulmonary nodule that is worrisome for bronchogenic carcinoma.  She has a smoking history that would predispose her to this.  She has some element of chronic obstructive pulmonary disease, but does not appear to be very severe.  My plan would be for her to have PET scan and depending on the results of PET scan, biopsy which may be able to be a fine needle aspiration.     Tkeyah Burkman L. Luan Pulling, M.D.     ELH/MEDQ  D:  01/28/2013  T:  01/29/2013  Job:  [id number]  cc:   Matthias Hughs, MD Fax: [phone]

## 2013-02-01 ENCOUNTER — Other Ambulatory Visit (HOSPITAL_COMMUNITY): Payer: Self-pay | Admitting: Pulmonary Disease

## 2013-02-01 DIAGNOSIS — R918 Other nonspecific abnormal finding of lung field: Secondary | ICD-10-CM

## 2013-02-03 ENCOUNTER — Ambulatory Visit (HOSPITAL_COMMUNITY)
Admit: 2013-02-03 | Discharge: 2013-02-03 | Disposition: A | Discharge status: Home / Self Care | Payer: Medicare HMO | Source: Ambulatory Visit | Attending: Cardiology | Admitting: Cardiology

## 2013-02-03 DIAGNOSIS — H539 Unspecified visual disturbance: Secondary | ICD-10-CM | POA: Insufficient documentation

## 2013-02-03 DIAGNOSIS — I6529 Occlusion and stenosis of unspecified carotid artery: Secondary | ICD-10-CM | POA: Insufficient documentation

## 2013-02-03 DIAGNOSIS — I658 Occlusion and stenosis of other precerebral arteries: Secondary | ICD-10-CM | POA: Insufficient documentation

## 2013-02-03 DIAGNOSIS — E041 Nontoxic single thyroid nodule: Secondary | ICD-10-CM | POA: Insufficient documentation

## 2013-02-03 DIAGNOSIS — F17200 Nicotine dependence, unspecified, uncomplicated: Secondary | ICD-10-CM | POA: Insufficient documentation

## 2013-02-03 DIAGNOSIS — I10 Essential (primary) hypertension: Secondary | ICD-10-CM | POA: Insufficient documentation

## 2013-02-03 DIAGNOSIS — R0989 Other specified symptoms and signs involving the circulatory and respiratory systems: Secondary | ICD-10-CM | POA: Insufficient documentation

## 2013-02-12 ENCOUNTER — Encounter (HOSPITAL_COMMUNITY)
Admission: RE | Admit: 2013-02-12 | Discharge: 2013-02-12 | Disposition: A | Discharge status: Home / Self Care | Payer: Medicare HMO | Source: Ambulatory Visit | Attending: Pulmonary Disease | Admitting: Pulmonary Disease

## 2013-02-12 ENCOUNTER — Encounter (HOSPITAL_COMMUNITY): Payer: Self-pay

## 2013-02-12 DIAGNOSIS — C3490 Malignant neoplasm of unspecified part of unspecified bronchus or lung: Secondary | ICD-10-CM | POA: Insufficient documentation

## 2013-02-12 DIAGNOSIS — R911 Solitary pulmonary nodule: Secondary | ICD-10-CM | POA: Insufficient documentation

## 2013-02-12 DIAGNOSIS — R918 Other nonspecific abnormal finding of lung field: Secondary | ICD-10-CM

## 2013-02-12 LAB — GLUCOSE, CAPILLARY: Glucose-Capillary: 118 mg/dL — ABNORMAL HIGH (ref 70–99)

## 2013-02-12 MED ORDER — FLUDEOXYGLUCOSE F - 18 (FDG) INJECTION
7.9000 | Freq: Once | INTRAVENOUS | Status: AC | PRN
  Administered 2013-02-12: 7.9 via INTRAVENOUS

## 2013-02-16 ENCOUNTER — Other Ambulatory Visit (HOSPITAL_COMMUNITY): Payer: Self-pay | Admitting: Pulmonary Disease

## 2013-02-16 ENCOUNTER — Encounter (HOSPITAL_COMMUNITY): Payer: Self-pay | Admitting: Pharmacy Technician

## 2013-02-16 ENCOUNTER — Other Ambulatory Visit: Payer: Self-pay | Admitting: Radiology

## 2013-02-16 DIAGNOSIS — R911 Solitary pulmonary nodule: Secondary | ICD-10-CM

## 2013-02-17 ENCOUNTER — Ambulatory Visit (HOSPITAL_COMMUNITY)
Admission: RE | Admit: 2013-02-17 | Discharge: 2013-02-17 | Disposition: A | Discharge status: Home / Self Care | Payer: Medicare HMO | Source: Ambulatory Visit | Attending: Pulmonary Disease | Admitting: Pulmonary Disease

## 2013-02-17 ENCOUNTER — Ambulatory Visit (HOSPITAL_COMMUNITY)
Admission: RE | Admit: 2013-02-17 | Discharge: 2013-02-17 | Disposition: A | Discharge status: Home / Self Care | Payer: Medicare HMO | Source: Ambulatory Visit | Attending: Interventional Radiology | Admitting: Interventional Radiology

## 2013-02-17 ENCOUNTER — Encounter (HOSPITAL_COMMUNITY): Payer: Self-pay

## 2013-02-17 DIAGNOSIS — J4489 Other specified chronic obstructive pulmonary disease: Secondary | ICD-10-CM | POA: Insufficient documentation

## 2013-02-17 DIAGNOSIS — I10 Essential (primary) hypertension: Secondary | ICD-10-CM | POA: Insufficient documentation

## 2013-02-17 DIAGNOSIS — I5022 Chronic systolic (congestive) heart failure: Secondary | ICD-10-CM | POA: Insufficient documentation

## 2013-02-17 DIAGNOSIS — R911 Solitary pulmonary nodule: Secondary | ICD-10-CM

## 2013-02-17 DIAGNOSIS — I428 Other cardiomyopathies: Secondary | ICD-10-CM | POA: Insufficient documentation

## 2013-02-17 DIAGNOSIS — C3430 Malignant neoplasm of lower lobe, unspecified bronchus or lung: Secondary | ICD-10-CM | POA: Insufficient documentation

## 2013-02-17 DIAGNOSIS — Z87891 Personal history of nicotine dependence: Secondary | ICD-10-CM | POA: Insufficient documentation

## 2013-02-17 DIAGNOSIS — C3490 Malignant neoplasm of unspecified part of unspecified bronchus or lung: Secondary | ICD-10-CM

## 2013-02-17 DIAGNOSIS — E785 Hyperlipidemia, unspecified: Secondary | ICD-10-CM | POA: Insufficient documentation

## 2013-02-17 DIAGNOSIS — J449 Chronic obstructive pulmonary disease, unspecified: Secondary | ICD-10-CM | POA: Insufficient documentation

## 2013-02-17 HISTORY — DX: Malignant neoplasm of unspecified part of unspecified bronchus or lung: C34.90

## 2013-02-17 HISTORY — PX: LUNG BIOPSY: SHX232

## 2013-02-17 LAB — CBC
HEMATOCRIT: 37.6 % (ref 36.0–46.0)
Hemoglobin: 12.5 g/dL (ref 12.0–15.0)
MCH: 30.5 pg (ref 26.0–34.0)
MCHC: 33.2 g/dL (ref 30.0–36.0)
MCV: 91.7 fL (ref 78.0–100.0)
Platelets: 290 10*3/uL (ref 150–400)
RBC: 4.1 MIL/uL (ref 3.87–5.11)
RDW: 13.5 % (ref 11.5–15.5)
WBC: 7.6 10*3/uL (ref 4.0–10.5)

## 2013-02-17 LAB — PROTIME-INR
INR: 0.86 (ref 0.00–1.49)
Prothrombin Time: 11.6 seconds (ref 11.6–15.2)

## 2013-02-17 LAB — APTT: aPTT: 25 seconds (ref 24–37)

## 2013-02-17 MED ORDER — LIDOCAINE HCL 1 % IJ SOLN
INTRAMUSCULAR | Status: AC
  Filled 2013-02-17: qty 10

## 2013-02-17 MED ORDER — MIDAZOLAM HCL 2 MG/2ML IJ SOLN
INTRAMUSCULAR | Status: AC
  Filled 2013-02-17: qty 4

## 2013-02-17 MED ORDER — MIDAZOLAM HCL 2 MG/2ML IJ SOLN
INTRAMUSCULAR | Status: AC | PRN
  Administered 2013-02-17 (×2): 1 mg via INTRAVENOUS

## 2013-02-17 MED ORDER — FENTANYL CITRATE 0.05 MG/ML IJ SOLN
INTRAMUSCULAR | Status: AC | PRN
  Administered 2013-02-17 (×2): 12.5 ug via INTRAVENOUS

## 2013-02-17 MED ORDER — SODIUM CHLORIDE 0.9 % IV SOLN: INTRAVENOUS | Status: DC

## 2013-02-17 MED ORDER — FENTANYL CITRATE 0.05 MG/ML IJ SOLN
INTRAMUSCULAR | Status: AC
  Filled 2013-02-17: qty 4

## 2013-02-17 MED ORDER — HYDROCODONE-ACETAMINOPHEN 5-325 MG PO TABS
1.0000 | ORAL_TABLET | ORAL | Status: DC | PRN
  Filled 2013-02-17: qty 2

## 2013-02-17 NOTE — Discharge Instructions (Signed)
Needle Biopsy of Lung, Care After Refer to this sheet in the next few weeks. These instructions provide you with information on caring for yourself after your procedure. Your health care provider may also give you more specific instructions. Your treatment has been planned according to current medical practices, but problems sometimes occur. Call your health care provider if you have any problems or questions after your procedure. WHAT TO EXPECT AFTER THE PROCEDURE A bandage will be applied over the areas where the needle was inserted. You may be asked to apply pressure to the bandage for several minutes to ensure there is minimal bleeding. In most cases, you can leave when your needle biopsy procedure is completed. Do not drive yourself home. Someone else should take you home. If you received an IV sedative or general anesthetic, you will be taken to a comfortable place to relax while the medication wears off. If you have upcoming travel scheduled, talk to your doctor about when it is safe to travel by air after the procedure. HOME CARE INSTRUCTIONS Expect to take it easy for the rest of the day. Protect the area where you received the needle biopsy by keeping the bandage in place for as long as instructed. You may feel some mild pain or discomfort in the area, but this should stop in a day or two. Only take over-the-counter or prescription medicines for pain, discomfort, or fever as directed by your caregiver. SEEK MEDICAL CARE IF:   You have pain at the biopsy site that worsens or is not helped by medication.  You have swelling or drainage at the needle biopsy site.  You have a fever. SEEK IMMEDIATE MEDICAL CARE IF:   You have new or worsening shortness of breath.  You have chest pain.  You are coughing up blood.  You have bleeding that does not stop with pressure or a bandage.  You develop light-headedness or fainting. Document Released: 10/21/2006 Document Revised: 08/26/2012 Document  Reviewed: 05/18/2012 [facility] Patient Information 2014 Alicia Davidson.

## 2013-02-17 NOTE — Procedures (Signed)
.  CT LLL lung lesion core bx 18g x3 to surg path No ptx. No complication No blood loss. See complete dictation in [facility].

## 2013-02-17 NOTE — H&P (Signed)
LLL pet+ no mets, for bx.

## 2013-02-17 NOTE — H&P (Signed)
Alicia Davidson is an 80 y.o. female.   Chief Complaint: pt was admitted to [facility] 01/26/2013 after syncopal episode Work up included CTA to rule out PE which was neg But incidental finding of LLL mass Referred to Dr Luan Pulling; +PET 02/12/2013 Now schedule for Left lung mass biopsy  HPI: COPD; HTN; HLD; osteoporosis; Left lung mass  Past Medical History  Diagnosis Date  . COPD (chronic obstructive pulmonary disease)   . Essential hypertension, benign   . Hypercholesteremia   . Osteoporosis   . B12 deficiency   . Anemia associated with acute blood loss   . Gastric ulcer with hemorrhage but without obstruction   . Weight loss, non-intentional   . Anxiety   . Umbilical hernia   . Gastritis     Mild  . Chronic systolic heart failure     Diagnosed [place] March 2014  . Cancer   . Cardiomyopathy     Probable nonischemic, LVEF 40-45%    History reviewed. No pertinent past surgical history.  Family History  Problem Relation Age of Onset  . Heart disease Father   . Colon cancer Sister   . Kidney failure Brother   . Other Mother     Bright's disease   Social History:  reports that she quit smoking about 13 months ago. Her smoking use included Cigarettes. She has a 16.8 pack-year smoking history. She has never used smokeless tobacco. She reports that she does not drink alcohol or use illicit drugs.  Allergies: No Known Allergies   (Not in a hospital admission)  Results for orders placed during the hospital encounter of 02/17/13 (from the past 48 hour(s))  APTT     Status: None   Collection Time    02/17/13  7:31 AM      Result Value Ref Range   aPTT 25  24 - 37 seconds  CBC     Status: None   Collection Time    02/17/13  7:31 AM      Result Value Ref Range   WBC 7.6  4.0 - 10.5 K/uL   RBC 4.10  3.87 - 5.11 MIL/uL   Hemoglobin 12.5  12.0 - 15.0 g/dL   HCT 37.6  36.0 - 46.0 %   MCV 91.7  78.0 - 100.0 fL   MCH 30.5  26.0 - 34.0 pg   MCHC 33.2  30.0 - 36.0 g/dL   RDW 13.5  11.5 -  15.5 %   Platelets 290  150 - 400 K/uL  PROTIME-INR     Status: None   Collection Time    02/17/13  7:31 AM      Result Value Ref Range   Prothrombin Time 11.6  11.6 - 15.2 seconds   INR 0.86  0.00 - 1.49   No results found.  Review of Systems  Constitutional: Negative for fever and chills.  Respiratory: Positive for shortness of breath. Negative for cough.   Cardiovascular: Negative for chest pain.  Gastrointestinal: Negative for nausea, vomiting and abdominal pain.  Neurological: Positive for dizziness and weakness. Negative for headaches.    Blood pressure 153/62, pulse 65, temperature 97.7 F (36.5 C), temperature source Oral, resp. rate 18, height 4\' 11"  (1.499 m), weight 137 lb (62.143 kg), SpO2 99.00%. Physical Exam  Constitutional: She is oriented to person, place, and time. She appears well-developed.  Cardiovascular: Normal rate and regular rhythm.   No murmur heard. Respiratory: Effort normal and breath sounds normal. She has no wheezes.  GI: Soft.  Bowel sounds are normal. There is no tenderness.  Musculoskeletal: Normal range of motion.  Neurological: She is alert and oriented to person, place, and time.  Skin: Skin is warm and dry.  Psychiatric: She has a normal mood and affect. Her behavior is normal. Judgment and thought content normal.     Assessment/Plan Left lung mass +PET 02/12/13 Scheduled now for bx LLL mass Pt and dtr aware of procedure benefits and risks and agreeable to proceed Consent signed and in chart  Alicia Davidson A 02/17/2013, 8:46 AM

## 2013-02-18 ENCOUNTER — Ambulatory Visit (INDEPENDENT_AMBULATORY_CARE_PROVIDER_SITE_OTHER): Payer: Medicare HMO | Admitting: Adult Health

## 2013-02-18 ENCOUNTER — Encounter: Payer: Self-pay | Admitting: Adult Health

## 2013-02-18 VITALS — BP 105/58 | HR 50 | Ht 59.0 in | Wt 137.0 lb

## 2013-02-18 DIAGNOSIS — I5042 Chronic combined systolic (congestive) and diastolic (congestive) heart failure: Secondary | ICD-10-CM

## 2013-02-18 DIAGNOSIS — I10 Essential (primary) hypertension: Secondary | ICD-10-CM

## 2013-02-18 DIAGNOSIS — R55 Syncope and collapse: Secondary | ICD-10-CM

## 2013-02-18 NOTE — Progress Notes (Signed)
HPI: Alicia Davidson  is an 80 year old patient of Dr. Domenic Polite we are following for ongoing assessment and management of hypertension, diastolic dysfunction, history of reduced systolic function with an EF of 45%, with other history to include COPD, hypercholesterolemia, and cancer.  She was recently admitted to [facility] hospital in the setting of near syncope. The patient had a CT scan to rule out PE, found be negative, but did find a 2.6 cm lung nodule in the left lower lung. She had a biopsy of the lung mass on 02/18/2012. Results are pending at the time of this dictation.  She comes today feeling sore in her left posterior chest area from biopsy. She is slightly hypotensive today.   Carotid study did not demonstrate any significant carotid stenosis.    No Known Allergies  Current Outpatient Prescriptions  Medication Sig Dispense Refill  . Ascorbic Acid (VITAMIN C) 1000 MG tablet Take 1,000 mg by mouth 2 (two) times daily.      Marland Kitchen aspirin 81 MG tablet Take 81 mg by mouth daily.      Marland Kitchen atorvastatin (LIPITOR) 20 MG tablet Take 20 mg by mouth at bedtime.      . carvedilol (COREG) 6.25 MG tablet Take 1 tablet (6.25 mg total) by mouth 2 (two) times daily.  60 tablet  6  . digoxin (LANOXIN) 0.125 MG tablet Take 1 tablet (0.125 mg total) by mouth daily.  30 tablet  6  . folic acid (FOLVITE) 017 MCG tablet Take 400 mcg by mouth daily.      . furosemide (LASIX) 20 MG tablet Take 20 mg by mouth daily.      Marland Kitchen ipratropium-albuterol (DUONEB) 0.5-2.5 (3) MG/3ML SOLN Take 3 mLs by nebulization every 6 (six) hours as needed (shortness of breath).       Marland Kitchen levalbuterol (XOPENEX HFA) 45 MCG/ACT inhaler Inhale 2 puffs into the lungs every 6 (six) hours as needed for shortness of breath.       . Multiple Vitamins-Minerals (CENTRUM SILVER) tablet Take 1 tablet by mouth daily.      Marland Kitchen omeprazole (PRILOSEC OTC) 20 MG tablet Take 40 mg by mouth daily.      . potassium chloride SA (K-DUR,KLOR-CON) 20 MEQ tablet Take  1 tablet (20 mEq total) by mouth daily.  30 tablet  6  . valsartan (DIOVAN) 160 MG tablet Take 160 mg by mouth daily.       No current facility-administered medications for this visit.    Past Medical History  Diagnosis Date  . COPD (chronic obstructive pulmonary disease)   . Essential hypertension, benign   . Hypercholesteremia   . Osteoporosis   . B12 deficiency   . Anemia associated with acute blood loss   . Gastric ulcer with hemorrhage but without obstruction   . Weight loss, non-intentional   . Anxiety   . Umbilical hernia   . Gastritis     Mild  . Chronic systolic heart failure     Diagnosed [place] March 2014  . Cancer   . Cardiomyopathy     Probable nonischemic, LVEF 40-45%    History reviewed. No pertinent past surgical history.  ROS: Review of systems complete and found to be negative unless listed above PHYSICAL EXAM BP 105/58  Pulse 50  Ht 4\' 11"  (1.499 m)  Wt 137 lb (62.143 kg)  BMI 27.66 kg/m2  SpO2 95%  General: Well developed, well nourished, in no acute distress Head: Eyes PERRLA, No xanthomas.   Normal  cephalic and atramatic  Lungs: Clear bilaterally to auscultation and percussion. Heart: HRRR S1 S2, without MRG.  Pulses are 2+ & equal.            Left carotid bruit. No JVD.  No abdominal bruits. No femoral bruits. Abdomen: Bowel sounds are positive, abdomen soft and non-tender without masses or                  Hernia's noted. Msk:  Back normal, normal gait. Normal strength and tone for age. Extremities: No clubbing, cyanosis or edema.  DP +1 Neuro: Alert and oriented X 3. Psych:  Good affect, responds appropriately    ASSESSMENT AND PLAN

## 2013-02-18 NOTE — Progress Notes (Deleted)
Name: Alicia Davidson    DOB: December 25, 1930  Age: 80 y.o.  MR#: [id number]       PCP:  [name], MD      Insurance: Payor: [employer]ICARE / Plan: [employer] / Product Type: *No Product type* /   CC:    Chief Complaint  Patient presents with  . Hypertension    VS Filed Vitals:   02/18/13 1305  BP: 105/58  Pulse: 50  Height: 4\' 11"  (1.499 m)  Weight: 137 lb (62.143 kg)  SpO2: 95%    Weights Current Weight  02/18/13 137 lb (62.143 kg)  02/17/13 137 lb (62.143 kg)  01/26/13 139 lb 12.4 oz (63.4 kg)    Blood Pressure  BP Readings from Last 3 Encounters:  02/18/13 105/58  02/17/13 144/48  01/28/13 193/76     Admit date:  (Not on file) Last encounter with RMR:  10/20/2012   Allergy Review of patient's allergies indicates no known allergies.  Current Outpatient Prescriptions  Medication Sig Dispense Refill  . Ascorbic Acid (VITAMIN C) 1000 MG tablet Take 1,000 mg by mouth 2 (two) times daily.      Marland Kitchen aspirin 81 MG tablet Take 81 mg by mouth daily.      Marland Kitchen atorvastatin (LIPITOR) 20 MG tablet Take 20 mg by mouth at bedtime.      . carvedilol (COREG) 6.25 MG tablet Take 1 tablet (6.25 mg total) by mouth 2 (two) times daily.  60 tablet  6  . digoxin (LANOXIN) 0.125 MG tablet Take 1 tablet (0.125 mg total) by mouth daily.  30 tablet  6  . folic acid (FOLVITE) 119 MCG tablet Take 400 mcg by mouth daily.      . furosemide (LASIX) 20 MG tablet Take 20 mg by mouth daily.      Marland Kitchen ipratropium-albuterol (DUONEB) 0.5-2.5 (3) MG/3ML SOLN Take 3 mLs by nebulization every 6 (six) hours as needed (shortness of breath).       Marland Kitchen levalbuterol (XOPENEX HFA) 45 MCG/ACT inhaler Inhale 2 puffs into the lungs every 6 (six) hours as needed for shortness of breath.       . Multiple Vitamins-Minerals (CENTRUM SILVER) tablet Take 1 tablet by mouth daily.      Marland Kitchen omeprazole (PRILOSEC OTC) 20 MG tablet Take 40 mg by mouth daily.      . potassium chloride SA (K-DUR,KLOR-CON) 20 MEQ tablet Take 1 tablet  (20 mEq total) by mouth daily.  30 tablet  6  . valsartan (DIOVAN) 160 MG tablet Take 160 mg by mouth daily.       No current facility-administered medications for this visit.    Discontinued Meds:   There are no discontinued medications.  Patient Active Problem List   Diagnosis Date Noted  . Syncope 01/26/2013  . Near syncope 01/26/2013  . Lung nodule seen on imaging study 01/26/2013  . HTN (hypertension) 01/26/2013  . Diastolic dysfunction, left ventricle 01/26/2013  . Chronic combined systolic and diastolic heart failure 14/78/2956  . Mitral regurgitation 05/11/2012  . Dyspnea 08/27/2011  . COPD GOLD II 08/27/2011  . Smoking 08/27/2011  . Anemia 08/27/2011  . Essential hypertension, benign 08/27/2011    LABS    Component Value Date/Time   NA 140 01/27/2013 0540   NA 140 01/26/2013 1625   NA 139 07/07/2012 1000   K 3.9 01/27/2013 0540   K 4.3 01/26/2013 1625   K 4.9 07/07/2012 1000   CL 103 01/27/2013 0540   CL 100  01/26/2013 1625   CL 101 07/07/2012 1000   CO2 24 01/27/2013 0540   CO2 29 01/26/2013 1625   CO2 28 07/07/2012 1000   GLUCOSE 125* 01/27/2013 0540   GLUCOSE 120* 01/26/2013 1625   GLUCOSE 83 07/07/2012 1000   BUN 21 01/27/2013 0540   BUN 27* 01/26/2013 1625   BUN 26* 07/07/2012 1000   CREATININE 0.86 01/27/2013 0540   CREATININE 1.11* 01/26/2013 1625   CREATININE 1.05 07/07/2012 1000   CREATININE 1.11* 05/21/2012 0250   CREATININE 0.9 08/27/2011 1231   CALCIUM 8.7 01/27/2013 0540   CALCIUM 9.6 01/26/2013 1625   CALCIUM 9.4 07/07/2012 1000   GFRNONAA 62* 01/27/2013 0540   GFRNONAA 46* 01/26/2013 1625   GFRAA 72* 01/27/2013 0540   GFRAA 53* 01/26/2013 1625   CMP     Component Value Date/Time   NA 140 01/27/2013 0540   K 3.9 01/27/2013 0540   CL 103 01/27/2013 0540   CO2 24 01/27/2013 0540   GLUCOSE 125* 01/27/2013 0540   BUN 21 01/27/2013 0540   CREATININE 0.86 01/27/2013 0540   CREATININE 1.05 07/07/2012 1000   CALCIUM 8.7 01/27/2013 0540   PROT 6.7 01/27/2013 0540   ALBUMIN 3.3*  01/27/2013 0540   AST 22 01/27/2013 0540   ALT 25 01/27/2013 0540   ALKPHOS 75 01/27/2013 0540   BILITOT 0.2* 01/27/2013 0540   GFRNONAA 62* 01/27/2013 0540   GFRAA 72* 01/27/2013 0540       Component Value Date/Time   WBC 7.6 02/17/2013 0731   WBC 8.4 01/27/2013 0540   WBC 8.7 01/26/2013 1625   HGB 12.5 02/17/2013 0731   HGB 10.5* 01/27/2013 0540   HGB 11.6* 01/26/2013 1625   HCT 37.6 02/17/2013 0731   HCT 32.2* 01/27/2013 0540   HCT 35.7* 01/26/2013 1625   MCV 91.7 02/17/2013 0731   MCV 93.1 01/27/2013 0540   MCV 93.7 01/26/2013 1625    Lipid Panel  No results found for this basename: chol, trig, hdl, cholhdl, vldl, ldlcalc    ABG No results found for this basename: phart, pco2, pco2art, po2, po2art, hco3, tco2, acidbasedef, o2sat     Lab Results  Component Value Date   TSH 0.96 08/27/2011   BNP (last 3 results)  Recent Labs  01/26/13 1625  PROBNP 621.9*   Cardiac Panel (last 3 results) No results found for this basename: CKTOTAL, CKMB, TROPONINI, RELINDX,  in the last 72 hours  Iron/TIBC/Ferritin No results found for this basename: iron, tibc, ferritin     EKG Orders placed during the hospital encounter of 01/26/13  . ED EKG  . ED EKG  . EKG 12-LEAD  . EKG 12-LEAD  . EKG     Prior Assessment and Plan Problem List as of 02/18/2013     Cardiovascular and Mediastinum   Essential hypertension, benign   Last Assessment & Plan   10/20/2012 Office Visit Written 10/20/2012  2:40 PM by Lendon Colonel, NP     Good control of BP at this time. No changes in medication regimen.    Chronic combined systolic and diastolic heart failure   Last Assessment & Plan   10/20/2012 Office Visit Written 10/20/2012  2:38 PM by Lendon Colonel, NP     She is well compensated without evidence of fluid overload. Wt is stable. Echo demonstrated improved LV fx. Will continue current medication regimen. She will have follow up in 6 months.    Mitral regurgitation   Last Assessment &  Plan  06/16/2012 Office Visit Edited 06/16/2012 12:15 PM by Lendon Colonel, NP     Per echocardiogram the patient was found to have moderate mitral regurg, with a moderately dilated left atrium and mildly to markedly dilated right ventricle. Continue heart rate and blood pressure control.    Syncope   Near syncope   HTN (hypertension)   Diastolic dysfunction, left ventricle     Respiratory   COPD GOLD II   Last Assessment & Plan   10/20/2012 Office Visit Written 10/20/2012  2:39 PM by Lendon Colonel, NP     She is to continue follow up with pulmonologist in [place]. She will stop her HS O2 for now. Continue neb tx.      Other   Dyspnea   Last Assessment & Plan   08/03/2012 Office Visit Written 08/03/2012  4:41 PM by Lendon Colonel, NP     This is related to chronic COPD. There is no wheezes or productive coughing on exam. No changes in her medicines.    Smoking   Last Assessment & Plan   10/14/2011 Office Visit Written 10/14/2011  3:24 PM by Tanda Rockers, MD     I reviewed the Flethcher curve with patient that basically indicates  if you quit smoking when your best day FEV1 is still well preserved (which hers is) it is highly unlikely you will progress to severe disease and informed the patient there was no medication on the market that has proven to change the curve or the likelihood of progression.  Therefore stopping smoking and maintaining abstinence is the most important aspect of care, not choice of inhalers or for that matter, doctors.   Pulmonary f/u can be prn    Anemia   Last Assessment & Plan   08/27/2011 Office Visit Written 08/27/2011  3:22 PM by Tanda Rockers, MD       Lab 08/27/11 1231  HGB 8.6 Repeated and verified X2.*    Normocytic with recent gi w/u neg in [place], should continue Fe for now and f/u with primary    Lung nodule seen on imaging study       Imaging: Dg Chest 1 View  02/17/2013   CLINICAL DATA:  Left lower lobe mass.  Status post needle  biopsy.  EXAM: CHEST - 1 VIEW  COMPARISON:  Chest x-ray dated 01/26/2013  FINDINGS: There is no pneumothorax on the right or left. The mass in the left lower lobe is not apparent on this exam. There is a ill-defined density in that area.  Lungs are hyperinflated consistent with emphysema. Heart size and pulmonary vascularity are normal. No acute osseous abnormality.  IMPRESSION: No acute abnormality. No pneumothorax. Mass in the left lower lobe is quite indistinct.   Electronically Signed   By: Rozetta Nunnery M.D.   On: 02/17/2013 12:42   Dg Chest 2 View  01/26/2013   CLINICAL DATA:  Near-syncope  EXAM: CHEST  2 VIEW  COMPARISON:  01/09/2012  FINDINGS: Heart is mildly enlarged. Interstitial prominence of bronchitic changes are stable. Increased AP diameter of the chest is a feature of COPD. Stable thoracic spine.  IMPRESSION: Cardiomegaly without decompensation.  Chronic changes.   Electronically Signed   By: Maryclare Bean M.D.   On: 01/26/2013 17:07   Ct Head Wo Contrast  01/26/2013   CLINICAL DATA:  Dizziness. History of cancer.  EXAM: CT HEAD WITHOUT CONTRAST  TECHNIQUE: Contiguous axial images were obtained from the base of the skull through the  vertex without contrast.  COMPARISON:  None  FINDINGS: No evidence for acute hemorrhage, mass lesion, midline shift, hydrocephalus or large infarct. There is subtle low-density in the parietal subcortical white matter which most likely represents chronic small vessel ischemic changes. No evidence for vasogenic edema. The visualized sinuses are clear. There may be a small osteoma in the right frontal sinus.  IMPRESSION: No acute intracranial abnormality.   Electronically Signed   By: Markus Daft M.D.   On: 01/26/2013 17:21   Ct Angio Chest Pe W/cm &/or Wo Cm  01/26/2013   CLINICAL DATA:  Shortness of breath. Near syncopal episode. Dizziness.  EXAM: CT ANGIOGRAPHY CHEST WITH CONTRAST  TECHNIQUE: Multidetector CT imaging of the chest was performed using the standard  protocol during bolus administration of intravenous contrast. Multiplanar CT image reconstructions including MIPs were obtained to evaluate the vascular anatomy.  CONTRAST:  149mL OMNIPAQUE IOHEXOL 350 MG/ML SOLN  COMPARISON:  None.  FINDINGS: Satisfactory opacification of pulmonary arteries noted, and no pulmonary emboli identified. No evidence of thoracic aortic dissection or aneurysm. No evidence of mediastinal hematoma or mass. Moderate cardiomegaly noted. No evidence of pleural or pericardial effusion.  A lobulated pulmonary nodule is seen in the left lower lobe on image 45 which measures 2.6 cm. This is consistent with a primary bronchogenic carcinoma. No other suspicious pulmonary nodules or masses are identified there is no evidence of hilar or mediastinal lymphadenopathy. No adenopathy seen elsewhere within the thorax. Both adrenal glands are normal in appearance. No suspicious bone lesions identified.  Review of the MIP images confirms the above findings.  IMPRESSION: No evidence of pulmonary embolism.  2.6 cm left lower lobe nodule, consistent with primary bronchogenic carcinoma. No evidence of thoracic metastatic disease. Consider PET-CT scan for preoperative staging evaluation.  Moderate cardiomegaly.   Electronically Signed   By: Earle Gell M.D.   On: 01/26/2013 18:33   US Carotid Duplex Bilateral  02/03/2013   CLINICAL DATA:  Carotid bruit. Visual disturbance. Hypertension, tobacco abuse.  EXAM: BILATERAL CAROTID DUPLEX ULTRASOUND  TECHNIQUE: Pearline Cables scale imaging, color Doppler and duplex ultrasound was performed of bilateral carotid and vertebral arteries in the neck.  COMPARISON:  None.  REVIEW OF SYSTEMS: Quantification of carotid stenosis is based on velocity parameters that correlate the residual internal carotid diameter with NASCET-based stenosis levels, using the diameter of the distal internal carotid lumen as the denominator for stenosis measurement.  The following velocity measurements  were obtained:  PEAK SYSTOLIC/END DIASTOLIC  RIGHT  ICA:                     103/9cm/sec  CCA:                     147/8GN/FAO  SYSTOLIC ICA/CCA RATIO:  1.30  DIASTOLIC ICA/CCA RATIO: 1.0  ECA:                     238cm/sec  LEFT  ICA:                     171/19cm/sec  CCA:                     865/78IO/NGE  SYSTOLIC ICA/CCA RATIO:  9.52  DIASTOLIC ICA/CCA RATIO: 8.41  ECA:                     255cm/sec  FINDINGS: RIGHT CAROTID ARTERY: Hypoechoic sub cm right  thyroid lesion incidentally noted. There is eccentric partially calcified plaque effaces the carotid bulb, extending to involve origins of external and internal carotid arteries. There is short segment stenosis at the external carotid origin with elevated peak systolic velocities. Plaque calcification with limits visualization of a portion of the proximal ICA. No definite high-grade stenosis. Normal waveforms and color Doppler signal.  RIGHT VERTEBRAL ARTERY:  Normal flow direction and waveform.  LEFT CAROTID ARTERY: Eccentric partially calcified plaque throughout the visualize common carotid artery, and effacing the carotid bulb. Plaque extends in the proximal internal and external carotid arteries. Elevated peak systolic velocities recorded in the proximal external carotid artery and proximal ICA. Normal color Doppler signal.  LEFT VERTEBRAL ARTERY: Retrograde flow.  IMPRESSION: 1. Bilateral carotid bifurcation and proximal ICA plaque, resulting in less than 50% diameter stenosis on the right, 50-69% diameter stenosis on the left. The exam does not exclude plaque ulceration or embolization. Continued surveillance recommended. 2. Retrograde flow in the left vertebral artery. Correlate with any clinical evidence of subclavian steal. 3. Subcentimeter right thyroid nodule, incompletely characterized.   Electronically Signed   By: Arne Cleveland M.D.   On: 02/03/2013 14:50   Nm Pet Image Initial (pi) Skull Base To Thigh  02/12/2013   CLINICAL DATA:  Initial  treatment strategy for lung carcinoma.  EXAM: NUCLEAR MEDICINE PET SKULL BASE TO THIGH  FASTING BLOOD GLUCOSE:  Value: 118 mg/dl  TECHNIQUE: 7.9 mCi F-18 FDG was injected intravenously. CT data was obtained and used for attenuation correction and anatomic localization only. (This was not acquired as a diagnostic CT examination.) Additional exam technical data entered on technologist worksheet.  COMPARISON:  No comparisons  FINDINGS: NECK  No hypermetabolic lymph nodes in the neck.  CHEST  Within the right lower lobe there is a 2.8 cm nodule with SUV max 10.4. There are no hypermetabolic mediastinal lymph nodes. No additional pulmonary nodules.  ABDOMEN/PELVIS  Normal adrenal glands. No abnormal metabolic activity the liver. No hypermetabolic abdominal pelvic lymph nodes.  SKELETON  No focal hypermetabolic activity to suggest skeletal metastasis.  IMPRESSION: 1. Hypermetabolic right lower lobe nodule. 2. Staging by FDG PET imaging T 1b  N0 M0   Electronically Signed   By: Suzy Bouchard M.D.   On: 02/12/2013 13:46   Ct Biopsy  02/17/2013   CLINICAL DATA:  Hypermetabolic left lower lobe lung nodule, no evidence of metastases on PET-CT.  EXAM: CT GUIDED CORE BIOPSY OF LEFT LOWER LOBE LUNG NODULE  ANESTHESIA/SEDATION: Intravenous Fentanyl and Versed were administered as conscious sedation during continuous cardiorespiratory monitoring by the radiology RN, with a total moderate sedation time of 13 minutes.  PROCEDURE: The procedure risks, benefits, and alternatives were explained to the patient. Questions regarding the procedure were encouraged and answered. The patient understands and consents to the procedure.  The patient was placed in left lateral decubitus positioning and limited axial scans through the lower chest obtained. The lesion was localized and an appropriate skin entry site determined.  The left posterior chest was prepped with Betadinein a sterile fashion, and a sterile drape was applied covering the  operative field. A sterile gown and sterile gloves were used for the procedure. Local anesthesia was provided with 1% Lidocaine.  Under CT fluoroscopic guidance, a 17 gauge trocar needle was advanced to the margin of the lesion. Once needle tip position was confirmed, coaxial 18-gauge core biopsy samples were obtained, submitted in formalin to surgical pathology. The guide needle was removed. Postprocedure scans demonstrate a  small amount of regional alveolar hemorrhage. No pneumothorax.  Complications: None. The patient tolerated the procedure well.  FINDINGS: Lobular left lower lobe nodule is again demonstrated. Percutaneous core needle biopsy performed as above.  IMPRESSION: 1. Technically successful CT-guided core biopsy of left lower lobe lung nodule. Observation and follow-up chest radiography is planned.   Electronically Signed   By: Arne Cleveland M.D.   On: 02/17/2013 13:24

## 2013-02-18 NOTE — Assessment & Plan Note (Signed)
Blood pressure is slightly low today. I rechecked it manually and this correlated with triage recordings. She was NPO for 24 hours yesterday prior to biopsy. She has not been eating or drinking well since. I suspect that she may be a little dehydrated. I will hold her lasix for two days and then resume. If BP stays low, they are to call us.   I will see her again in one month for reassessment of her BP and recommendations for adjustments in antihypertensives if necessary.

## 2013-02-18 NOTE — Assessment & Plan Note (Signed)
As discussed below. F/U in one month for possible adjustments in antihypertensives.

## 2013-02-18 NOTE — Assessment & Plan Note (Signed)
Carotid ultrasound completed without significant stenosis.

## 2013-02-18 NOTE — Patient Instructions (Signed)
Your physician recommends that you schedule a follow-up appointment in:  1 month  Your physician has recommended you make the following change in your medication:  Please hold your Lasix tomorrow and Saturday.

## 2013-03-02 ENCOUNTER — Encounter: Payer: Self-pay | Admitting: *Deleted

## 2013-03-02 ENCOUNTER — Other Ambulatory Visit (HOSPITAL_COMMUNITY): Payer: Self-pay

## 2013-03-02 DIAGNOSIS — J441 Chronic obstructive pulmonary disease with (acute) exacerbation: Secondary | ICD-10-CM

## 2013-03-02 NOTE — [facility] Oncology Navigator Note (Unsigned)
Called Dr. Kathaleen Grinder office.  Left vm message to call back regarding pt.

## 2013-03-09 ENCOUNTER — Institutional Professional Consult (permissible substitution) (INDEPENDENT_AMBULATORY_CARE_PROVIDER_SITE_OTHER): Payer: Medicare HMO | Admitting: Thoracic Surgery (Cardiothoracic Vascular Surgery)

## 2013-03-09 ENCOUNTER — Encounter: Payer: Self-pay | Admitting: Thoracic Surgery (Cardiothoracic Vascular Surgery)

## 2013-03-09 VITALS — BP 178/84 | HR 68 | Resp 20 | Ht 59.0 in | Wt 139.0 lb

## 2013-03-09 DIAGNOSIS — J449 Chronic obstructive pulmonary disease, unspecified: Secondary | ICD-10-CM

## 2013-03-09 DIAGNOSIS — I509 Heart failure, unspecified: Secondary | ICD-10-CM

## 2013-03-09 DIAGNOSIS — D649 Anemia, unspecified: Secondary | ICD-10-CM

## 2013-03-09 DIAGNOSIS — C3490 Malignant neoplasm of unspecified part of unspecified bronchus or lung: Secondary | ICD-10-CM

## 2013-03-09 DIAGNOSIS — I700 Atherosclerosis of aorta: Secondary | ICD-10-CM

## 2013-03-09 NOTE — Progress Notes (Signed)
PCP is [name], MD Referring Provider is Alonza Bogus, MD  Chief Complaint  Patient presents with  . Lung Cancer    surgical eval, PET Scan 02/23/13, CT BX 02/17/13. Chest CT 01/26/13    HPI:80 year old woman sent for evaluation of a left lower lobe squamous cell carcinoma.  Alicia Davidson is an 80 year old woman with a history of smoking one pack a day for 55 years. She did quit smoking about a year and a half ago. She also has a history of coronary disease, congestive heart failure, COPD, hypertension, hyperlipidemia, and anemia.  She was in her usual state of health until about a month ago ago when she was out shopping. She says that she suddenly felt flushed and diaphoretic and dizzy. She went to the emergency room to be evaluated. Her blood pressure was low and she was felt to be dehydrated. As part of her workup she had a CT angiogram of the chest to rule out pulmonary embolus. She was found to have a 2.6 cm spiculated nodule in the left lower lobe. She subsequently had a PET/CT which showed this lesion is hypermetabolic with an SUV of 93.2. Also showed no evidence of metastasis. She underwent a CT-guided needle biopsy which revealed squamous cell carcinoma.  According to the patient and her daughter she has been feeling very fatigued lately. She says that for several months now she's had decreased energy. She get short of breath with exertion, sometimes even minimal exertion such as walking to the kitchen. She has had a cough. It has been nonproductive. She denies hemoptysis. She has not had any significant weight loss.  On questioning the daughter states that the mother could not walk up 2 flights of stairs and appeared winded when she walked from the waiting room to the examination room in the office. She has known COPD with FEV1 was 69% predicted in October of 2013. She also has known of congestive heart failure and diastolic dysfunction with ejection fraction 40-45% by  echocardiogram in October of 2014. She says that she gets a little bit of swelling in her legs but it is not a major problem.  ECOG/ZUBROD= 2   Past Medical History  Diagnosis Date  . COPD (chronic obstructive pulmonary disease)   . Essential hypertension, benign   . Hypercholesteremia   . Osteoporosis   . B12 deficiency   . Anemia associated with acute blood loss   . Gastric ulcer with hemorrhage but without obstruction   . Weight loss, non-intentional   . Anxiety   . Umbilical hernia   . Gastritis     Mild  . Chronic systolic heart failure     Diagnosed [place] March 2014  . Cancer   . Cardiomyopathy     Probable nonischemic, LVEF 40-45%    No past surgical history on file. Tubal ligation  Family History  Problem Relation Age of Onset  . Heart disease Father   . Colon cancer Sister   . Kidney failure Brother   . Other Mother     Bright's disease    Social History History  Substance Use Topics  . Smoking status: Former Smoker -- 0.30 packs/day for 56 years    Types: Cigarettes    Quit date: 01/07/2012  . Smokeless tobacco: Never Used  . Alcohol Use: No    Current Outpatient Prescriptions  Medication Sig Dispense Refill  . Ascorbic Acid (VITAMIN C) 1000 MG tablet Take 1,000 mg by mouth 2 (two) times daily.      Marland Kitchen  aspirin 81 MG tablet Take 81 mg by mouth daily.      Marland Kitchen atorvastatin (LIPITOR) 20 MG tablet Take 20 mg by mouth at bedtime.      . carvedilol (COREG) 6.25 MG tablet Take 1 tablet (6.25 mg total) by mouth 2 (two) times daily.  60 tablet  6  . digoxin (LANOXIN) 0.125 MG tablet Take 1 tablet (0.125 mg total) by mouth daily.  30 tablet  6  . folic acid (FOLVITE) 272 MCG tablet Take 400 mcg by mouth daily.      . furosemide (LASIX) 20 MG tablet Take 20 mg by mouth daily.      Marland Kitchen ipratropium-albuterol (DUONEB) 0.5-2.5 (3) MG/3ML SOLN Take 3 mLs by nebulization every 6 (six) hours as needed (shortness of breath).       Marland Kitchen levalbuterol (XOPENEX HFA) 45 MCG/ACT  inhaler Inhale 2 puffs into the lungs every 6 (six) hours as needed for shortness of breath.       . Multiple Vitamins-Minerals (CENTRUM SILVER) tablet Take 1 tablet by mouth daily.      Marland Kitchen omeprazole (PRILOSEC OTC) 20 MG tablet Take 40 mg by mouth daily.      . potassium chloride SA (K-DUR,KLOR-CON) 20 MEQ tablet Take 1 tablet (20 mEq total) by mouth daily.  30 tablet  6  . valsartan (DIOVAN) 160 MG tablet Take 160 mg by mouth daily.       No current facility-administered medications for this visit.    No Known Allergies  Review of Systems  Constitutional: Positive for activity change and fatigue. Negative for fever, appetite change and unexpected weight change.  Eyes: Positive for visual disturbance.  Respiratory: Positive for cough (nonproductive) and shortness of breath (with minimal activity). Negative for wheezing (not  recently).   Cardiovascular: Negative for chest pain.  Gastrointestinal: Positive for abdominal pain.       Hiatal hernia  Genitourinary: Positive for frequency.  Musculoskeletal:       Leg cramps  Neurological: Positive for dizziness and weakness.  All other systems reviewed and are negative.    BP 178/84  Pulse 68  Resp 20  Ht 4\' 11"  (1.499 m)  Wt 139 lb (63.05 kg)  BMI 28.06 kg/m2  SpO2 97% Physical Exam  Vitals reviewed. Constitutional: She is oriented to person, place, and time. No distress.  obese  HENT:  Head: Normocephalic and atraumatic.  Eyes: EOM are normal. Pupils are equal, round, and reactive to light.  Neck: Neck supple. No thyromegaly present.  Cardiovascular: Normal rate and regular rhythm.   Murmur (2/6 systolic) heard. Pulmonary/Chest: Effort normal. She has wheezes (faint bilaterally).  Abdominal: Soft. There is no tenderness.  Musculoskeletal: She exhibits no edema.  Lymphadenopathy:    She has no cervical adenopathy.  Neurological: She is alert and oriented to person, place, and time. No cranial nerve deficit.  Skin: Skin is  warm and dry.     Diagnostic Tests:  CT ANGIOGRAPHY CHEST WITH CONTRAST  TECHNIQUE:  Multidetector CT imaging of the chest was performed using the  standard protocol during bolus administration of intravenous  contrast. Multiplanar CT image reconstructions including MIPs were  obtained to evaluate the vascular anatomy.  CONTRAST: 127mL OMNIPAQUE IOHEXOL 350 MG/ML SOLN  COMPARISON: None.  FINDINGS:  Satisfactory opacification of pulmonary arteries noted, and no  pulmonary emboli identified. No evidence of thoracic aortic  dissection or aneurysm. No evidence of mediastinal hematoma or mass.  Moderate cardiomegaly noted. No evidence of pleural or pericardial  effusion.  A lobulated pulmonary nodule is seen in the left lower lobe on image  45 which measures 2.6 cm. This is consistent with a primary  bronchogenic carcinoma. No other suspicious pulmonary nodules or  masses are identified there is no evidence of hilar or mediastinal  lymphadenopathy. No adenopathy seen elsewhere within the thorax.  Both adrenal glands are normal in appearance. No suspicious bone  lesions identified.  Review of the MIP images confirms the above findings.  IMPRESSION:  No evidence of pulmonary embolism.  2.6 cm left lower lobe nodule, consistent with primary bronchogenic  carcinoma. No evidence of thoracic metastatic disease. Consider  PET-CT scan for preoperative staging evaluation.  Moderate cardiomegaly.  Electronically Signed  By: Earle Gell M.D.  On: 01/26/2013 18:33  PET/CT ADDENDUM:  In reviewing this PET-CT examination for thoracic Oncology  Conference on 02/23/2013, it is noted that there is in error in both  the findings and impression section. The hypermetabolic nodule is in  the LEFT lower lobe, not the right lower lobe. Patient underwent  biopsy of the left lower lobe nodule on 02/17/2013.  Electronically Signed  By: Camie Patience M.D.  On: 02/23/2013 11:18       Study Result     CLINICAL DATA: Initial treatment strategy for lung carcinoma.  EXAM:  NUCLEAR MEDICINE PET SKULL BASE TO THIGH  FASTING BLOOD GLUCOSE: Value: 118 mg/dl  TECHNIQUE:  7.9 mCi F-18 FDG was injected intravenously. CT data was obtained  and used for attenuation correction and anatomic localization only.  (This was not acquired as a diagnostic CT examination.) Additional  exam technical data entered on technologist worksheet.  COMPARISON: No comparisons  FINDINGS:  NECK  No hypermetabolic lymph nodes in the neck.  CHEST  Within the right lower lobe there is a 2.8 cm nodule with SUV max  10.4. There are no hypermetabolic mediastinal lymph nodes. No  additional pulmonary nodules.  ABDOMEN/PELVIS  Normal adrenal glands. No abnormal metabolic activity the liver. No  hypermetabolic abdominal pelvic lymph nodes.  SKELETON  No focal hypermetabolic activity to suggest skeletal metastasis.  IMPRESSION:  1. Hypermetabolic right lower lobe nodule.  2. Staging by FDG PET imaging T 1b N0 M0  Electronically Signed:  By: Suzy Bouchard M.D.  On: 02/12/2013 13:46    ECHOCARDIOGRAM 10/2012 Study Conclusions  - Left ventricle: The cavity size was normal. Wall thickness was increased increased in a pattern of mild to moderate LVH. Systolic function was mildly to moderately reduced. The estimated ejection fraction was in the range of 40% to 45%. Doppler parameters are consistent with abnormal left ventricular relaxation (grade 1 diastolic dysfunction). - Aortic valve: Mildly calcified annulus. Probably trileaflet; mildly thickened leaflets. Valve area: 1.6cm^2(VTI). Valve area: 1.72cm^2 (Vmax). - Mitral valve: Mildly calcified annulus. Mildly thickened leaflets . Mild regurgitation. - Left atrium: The atrium was severely dilated. - Right ventricle: Systolic function was mildly reduced. RV TAPSE is 1.4 cm. - Right atrium: The atrium was mildly dilated. - Pulmonary arteries: PA peak pressure:  71mm Hg (S). Transthoracic echocardiography. M-mode, complete 2D, spectral Doppler, and color Doppler. Height: Height: 149.9cm. Height: 59in. Weight: Weight: 62.6kg. Weight: 137.7lb. Body mass index: BMI: 27.9kg/m^2. Body surface area: BSA: 1.50m^2. Blood pressure: 134/62. Patient status: Outpatient. Location: Echo laboratory.  PATHOLOGY Diagnosis Lung, needle/core biopsy(ies) - SQUAMOUS CELL CARCINOMA. - SEE COMMENT. Microscopic Comment The carcinoma is high grade. The malignant cells are positive for cytokeratin 5/6 and negative for TTF-1, supporting the above diagnosis. The case was discussed  with Dr. Luan Pulling on 02/19/13. (JBK:gt, 02/19/13) Enid Cutter MD Pathologist, Electronic Signature  Impression: 80 year old woman with a history of tobacco abuse and COPD who is now found to have a 2.6 cm squamous cell carcinoma in the left lower lobe. She is clinical stage I by PET.  I had a long discussion with Mrs. Sochacki and her daughter. We talked about the staging of the lung cancer and possible treatments. They understand that generally speaking surgery as the treatment of choice. We did discuss SBRT as a possible option as well.  I have reservations about Mrs. Pink his ability to withstand a major pulmonary resection. Given the size and location of this nodule I do not think it is particularly amenable to wedge resection. Even with a segmentectomy would be difficult to achieve adequate margins. A lobectomy would be the best chance for cure but would have significant associated morbidity.  She needs new pulmonary function testing and that is scheduled for Friday March the 13th. However based on her previous pulmonary function testing I do not think that pulmonary reserve would be the primary limiting factor.   The biggest concern is that she just is generally deconditioning get short of breath with even minimal activities. One consideration that should be given that possibility that she has  some significant coronary disease. She has known congestive heart failure with an EF of 40 and diastolic dysfunction as well as and mild valvular disease. As far as I can tell she has not had any type of stress test or catheterization recently. She has extensive calcification in her aorta as well as her coronary arteries visible on CT scan. I suspect she has some significant underlying coronary disease. That would be a possible contributing factor in her fatigue and physical limitations.  I asked her daughter point blank if he she felt the patient could walk up 2 flights of stairs and she feels strongly that the patient would not be able to do so. Given that information I think she would have a difficult time recovering from a major pulmonary resection unless we can find some correctable cause.  I did discuss with Mrs. Mcmenamin and her daughter the possibility of SBRT. She was very reluctant to talk about this issue is noted with had problems with radiation. I explained that this is a newer technique without as much morbidity. She would like to hear more from radiation oncologist.  Plan: 1. Pulmonary function testing a scheduled  2. Radiation oncology consult  3. I will talk with Dr. Rozann Lesches her cardiologist to see if he feels that a stress test or catheterization would be appropriate in her case  4. Return in 2 weeks to discuss results and plan treatment

## 2013-03-11 ENCOUNTER — Encounter: Payer: Self-pay | Admitting: Radiation Oncology

## 2013-03-11 DIAGNOSIS — C3490 Malignant neoplasm of unspecified part of unspecified bronchus or lung: Secondary | ICD-10-CM | POA: Insufficient documentation

## 2013-03-12 ENCOUNTER — Encounter: Payer: Self-pay | Admitting: Radiation Oncology

## 2013-03-12 NOTE — Progress Notes (Signed)
Thoracic Location of Tumor / Histology: LLL lung- squamous cell carcinoma  Patient presented 1.5 months ago with symptoms of: episode of feeling flushed, diaphoretic, weak, dizzy while shopping  Biopsies of LLL lung (if applicable) revealed:  07/02/92 Diagnosis Lung, needle/core biopsy(ies) - SQUAMOUS CELL CARCINOMA. - SEE COMMENT. Microscopic Comment The carcinoma is high grade. The malignant cells are positive for cytokeratin 5/6 and negative for TTF-1, supporting the above diagnosis.  Tobacco/Marijuana/Snuff/ETOH use: smoked cigarettes x 55 yrs, 1 PPD, quit 2013, no smokeless tobacco, no alcohol  Past/Anticipated interventions by cardiothoracic surgery, if any: Dr Roxan Hockey:  I have reservations about Alicia Davidson his ability to withstand a major pulmonary resection. Given the size and location of this nodule I do not think it is particularly amenable to wedge resection. Even with a segmentectomy would be difficult to achieve adequate margins. A lobectomy would be the best chance for cure but would have significant associated morbidity.  Past/Anticipated interventions by medical oncology, if any: none at this time, has not seen medical oncologist  Signs/Symptoms  Weight changes, if any: no  Respiratory complaints, if any: SOB w/minimal activity  Hemoptysis, if any: no  Pain issues, if any:  no  SAFETY ISSUES:  Prior radiation? no  Pacemaker/ICD? no  Possible current pregnancy? no  Is the patient on methotrexate? no  Current Complaints / other details:  Pulm functions test completed on 03/15/13 @ 3:30 pm, [facility].  Daughter lives w/pt. Pt denies pain and loss of appetite./ She is fatigued, occasional dry cough, SOB w/minimal exertion.

## 2013-03-15 ENCOUNTER — Ambulatory Visit (HOSPITAL_COMMUNITY)
Admission: RE | Admit: 2013-03-15 | Discharge: 2013-03-15 | Disposition: A | Discharge status: Home / Self Care | Payer: Medicare HMO | Source: Ambulatory Visit | Attending: Pulmonary Disease | Admitting: Pulmonary Disease

## 2013-03-15 DIAGNOSIS — J441 Chronic obstructive pulmonary disease with (acute) exacerbation: Secondary | ICD-10-CM | POA: Insufficient documentation

## 2013-03-15 LAB — BLOOD GAS, ARTERIAL
Acid-base deficit: 0.3 mmol/L (ref 0.0–2.0)
Bicarbonate: 23.3 mEq/L (ref 20.0–24.0)
FIO2: 0.21 %
O2 Saturation: 95.8 %
PCO2 ART: 34.5 mmHg — AB (ref 35.0–45.0)
Patient temperature: 37
TCO2: 21.2 mmol/L (ref 0–100)
pH, Arterial: 7.444 (ref 7.350–7.450)
pO2, Arterial: 79.1 mmHg — ABNORMAL LOW (ref 80.0–100.0)

## 2013-03-15 LAB — PULMONARY FUNCTION TEST
DL/VA % pred: 80 %
DL/VA: 3.26 ml/min/mmHg/L
DLCO UNC: 7.31 ml/min/mmHg
DLCO cor % pred: 45 %
DLCO cor: 7.97 ml/min/mmHg
DLCO unc % pred: 41 %
FEF 25-75 Post: 0.83 L/sec
FEF 25-75 Pre: 0.63 L/sec
FEF2575-%Change-Post: 32 %
FEF2575-%Pred-Post: 75 %
FEF2575-%Pred-Pre: 56 %
FEV1-%CHANGE-POST: 8 %
FEV1-%PRED-POST: 77 %
FEV1-%PRED-PRE: 70 %
FEV1-POST: 1.12 L
FEV1-Pre: 1.03 L
FEV1FVC-%CHANGE-POST: 5 %
FEV1FVC-%Pred-Pre: 93 %
FEV6-%Change-Post: 1 %
FEV6-%Pred-Post: 81 %
FEV6-%Pred-Pre: 79 %
FEV6-PRE: 1.49 L
FEV6-Post: 1.52 L
FEV6FVC-%PRED-PRE: 106 %
FEV6FVC-%Pred-Post: 106 %
FVC-%Change-Post: 3 %
FVC-%Pred-Post: 77 %
FVC-%Pred-Pre: 74 %
FVC-Post: 1.53 L
FVC-Pre: 1.49 L
POST FEV1/FVC RATIO: 73 %
Post FEV6/FVC ratio: 100 %
Pre FEV1/FVC ratio: 70 %
Pre FEV6/FVC Ratio: 100 %
RV % PRED: 82 %
RV: 1.78 L
TLC % PRED: 75 %
TLC: 3.25 L

## 2013-03-15 MED ORDER — ALBUTEROL SULFATE (2.5 MG/3ML) 0.083% IN NEBU
2.5000 mg | INHALATION_SOLUTION | Freq: Once | RESPIRATORY_TRACT | Status: AC
  Administered 2013-03-15: 2.5 mg via RESPIRATORY_TRACT

## 2013-03-16 ENCOUNTER — Encounter: Payer: Self-pay | Admitting: Radiation Oncology

## 2013-03-16 ENCOUNTER — Ambulatory Visit
Admission: RE | Admit: 2013-03-16 | Discharge: 2013-03-16 | Disposition: A | Discharge status: Home / Self Care | Payer: Medicare HMO | Source: Ambulatory Visit | Attending: Radiation Oncology | Admitting: Radiation Oncology

## 2013-03-16 VITALS — BP 96/63 | HR 69 | Temp 98.8°F | Resp 20 | Wt 140.9 lb

## 2013-03-16 DIAGNOSIS — I10 Essential (primary) hypertension: Secondary | ICD-10-CM | POA: Insufficient documentation

## 2013-03-16 DIAGNOSIS — Z7982 Long term (current) use of aspirin: Secondary | ICD-10-CM | POA: Insufficient documentation

## 2013-03-16 DIAGNOSIS — E7800 Pure hypercholesterolemia, unspecified: Secondary | ICD-10-CM | POA: Insufficient documentation

## 2013-03-16 DIAGNOSIS — C3490 Malignant neoplasm of unspecified part of unspecified bronchus or lung: Secondary | ICD-10-CM

## 2013-03-16 DIAGNOSIS — Z87891 Personal history of nicotine dependence: Secondary | ICD-10-CM | POA: Insufficient documentation

## 2013-03-16 DIAGNOSIS — J449 Chronic obstructive pulmonary disease, unspecified: Secondary | ICD-10-CM | POA: Insufficient documentation

## 2013-03-16 DIAGNOSIS — J4489 Other specified chronic obstructive pulmonary disease: Secondary | ICD-10-CM | POA: Insufficient documentation

## 2013-03-16 DIAGNOSIS — I5022 Chronic systolic (congestive) heart failure: Secondary | ICD-10-CM | POA: Insufficient documentation

## 2013-03-16 DIAGNOSIS — I428 Other cardiomyopathies: Secondary | ICD-10-CM | POA: Insufficient documentation

## 2013-03-16 DIAGNOSIS — Z79899 Other long term (current) drug therapy: Secondary | ICD-10-CM | POA: Insufficient documentation

## 2013-03-16 HISTORY — DX: Malignant neoplasm of unspecified part of unspecified bronchus or lung: C34.90

## 2013-03-16 NOTE — Progress Notes (Signed)
Please see the Nurse Progress Note in the MD Initial Consult Encounter for this patient. 

## 2013-03-16 NOTE — Progress Notes (Signed)
[facility] NEW PATIENT EVALUATION  Name: Alicia Davidson MRN: [id number]  Date:   03/16/2013           DOB: 1930-08-28  Status: outpatient   CC: Alicia Lair, MD  Melrose Nakayama, *    REFERRING PHYSICIAN: Melrose Nakayama, *   DIAGNOSIS: Stage I A (T1b N0 M0) squamous cell carcinoma of the left lung  HISTORY OF PRESENT ILLNESS:  Alicia Davidson is a 80 y.o. female who is seen today through the courtesy of Dr. Roxan Hockey for consideration of stereotactic radiosurgery in the management of her biopsy-proven squamous cell carcinoma of the left lung. On 01/26/2013 she underwent a CT angiogram to rule out pulmonary emboli after presenting with worsening dyspnea and near syncopal episode. She was found to have a 2.6 cm lobulated pulmonary nodule along the left lower lobe. She underwent a needle aspiration biopsy on 02/17/2013 which was diagnostic for high-grade squamous cell carcinoma. Her staging workup included a PET scan on 02/12/2013 which showed a 2.8 cm nodule within left lower lobe with a SUV max of 10.4. There were no hypermetabolic hilar or mediastinal lymph nodes. She is not felt to be a candidate for surgery based on her severe COPD and coronary artery disease. She seen today for consideration of stereotactic radiosurgery. She does have dyspnea on exertion. Her recent pulmonary function studies from yesterday show a FEV1 of 1.03, 70% of predicted.  PREVIOUS RADIATION THERAPY: No   PAST MEDICAL HISTORY:  has a past medical history of COPD (chronic obstructive pulmonary disease); Essential hypertension, benign; Hypercholesteremia; Osteoporosis; B12 deficiency; Anemia associated with acute blood loss; Gastric ulcer with hemorrhage but without obstruction; Weight loss, non-intentional; Anxiety; Umbilical hernia; Gastritis; Chronic systolic heart failure; Cancer; Cardiomyopathy; and Lung cancer (02/17/13).     PAST SURGICAL HISTORY:  Past Surgical History   Procedure Laterality Date  . Lung biopsy Left 02/17/13    LLL  . Tubal ligation       FAMILY HISTORY: family history includes Colon cancer in her sister; Heart disease in her father; Kidney failure in her brother; Other in her mother. Her father died of cardiac disease at 65 and her mother died from renal failure and 24.   SOCIAL HISTORY:  reports that she quit smoking about 14 months ago. Her smoking use included Cigarettes. She has a 16.8 pack-year smoking history. She has never used smokeless tobacco. She reports that she does not drink alcohol or use illicit drugs. Married, 5 children. She worked in a Journalist, newspaper.   ALLERGIES: Review of patient's allergies indicates no known allergies.   MEDICATIONS:  Current Outpatient Prescriptions  Medication Sig Dispense Refill  . Ascorbic Acid (VITAMIN C) 1000 MG tablet Take 1,000 mg by mouth 2 (two) times daily.      Marland Kitchen aspirin 81 MG tablet Take 81 mg by mouth daily.      Marland Kitchen atorvastatin (LIPITOR) 20 MG tablet Take 20 mg by mouth at bedtime.      . carvedilol (COREG) 6.25 MG tablet Take 1 tablet (6.25 mg total) by mouth 2 (two) times daily.  60 tablet  6  . digoxin (LANOXIN) 0.125 MG tablet Take 1 tablet (0.125 mg total) by mouth daily.  30 tablet  6  . folic acid (FOLVITE) 431 MCG tablet Take 400 mcg by mouth daily.      . furosemide (LASIX) 20 MG tablet Take 20 mg by mouth daily.      Marland Kitchen ipratropium-albuterol (DUONEB)  0.5-2.5 (3) MG/3ML SOLN Take 3 mLs by nebulization every 6 (six) hours as needed (shortness of breath).       Marland Kitchen levalbuterol (XOPENEX HFA) 45 MCG/ACT inhaler Inhale 2 puffs into the lungs every 6 (six) hours as needed for shortness of breath.       . Multiple Vitamins-Minerals (CENTRUM SILVER) tablet Take 1 tablet by mouth daily.      Marland Kitchen omeprazole (PRILOSEC OTC) 20 MG tablet Take 40 mg by mouth daily.      . potassium chloride SA (K-DUR,KLOR-CON) 20 MEQ tablet Take 1 tablet (20 mEq total) by mouth daily.  30 tablet  6  .  valsartan (DIOVAN) 160 MG tablet Take 160 mg by mouth daily.       No current facility-administered medications for this encounter.     REVIEW OF SYSTEMS:  Pertinent items are noted in HPI.    PHYSICAL EXAM:  weight is 140 lb 14.4 oz (63.912 kg). Her oral temperature is 98.8 F (37.1 C). Her blood pressure is 96/63 and her pulse is 69. Her respiration is 20 and oxygen saturation is 97%.   Alert and oriented 80 year-old white female appearing her stated age. Head and neck examination: Grossly unremarkable. Nodes: Without palpable cervical or supraclavicular lymphadenopathy. Chest: Lungs clear but breath sounds distant. Heart: Regular rate and rhythm. Abdomen: Without hepatomegaly. Extremities: 1+ ankle edema. Neurologic examination: Grossly nonfocal.   LABORATORY DATA:  Lab Results  Component Value Date   WBC 7.6 02/17/2013   HGB 12.5 02/17/2013   HCT 37.6 02/17/2013   MCV 91.7 02/17/2013   PLT 290 02/17/2013   Lab Results  Component Value Date   NA 140 01/27/2013   K 3.9 01/27/2013   CL 103 01/27/2013   CO2 24 01/27/2013   Lab Results  Component Value Date   ALT 25 01/27/2013   AST 22 01/27/2013   ALKPHOS 75 01/27/2013   BILITOT 0.2* 01/27/2013      IMPRESSION: Clinical stage IA (T1b N0 M0) high-grade squamous cell carcinoma of the left lung. We talked about the natural history of early lung cancer. She is not a candidate for surgery in view of her medical comorbidities. Her SUV of over 10 is a poor prognostic factor, but she does appear to have early stage disease based on her PET scan. She is a good candidate for SBRT which should be reasonably well tolerated. We discussed the potential acute and late toxicities of radiation therapy/SBRT. We discussed the fact that she may have worsening dyspnea following treatment. I believe that she is at low risk for a rib fracture. Consent is signed today. 2 daughters and her son in law were in attendance today. She will return for  simulation/treatment planning tomorrow. I understand that she will see Dr. Domenic Polite for cardiology evaluation this Thursday as requested by Dr. Roxan Hockey.   PLAN: As discussed above.  I spent 60 minutes minutes face to face with the patient and more than 50% of that time was spent in counseling and/or coordination of care.

## 2013-03-17 ENCOUNTER — Ambulatory Visit
Admission: RE | Admit: 2013-03-17 | Discharge: 2013-03-17 | Disposition: A | Discharge status: Home / Self Care | Payer: Medicare HMO | Source: Ambulatory Visit | Attending: Radiation Oncology | Admitting: Radiation Oncology

## 2013-03-17 DIAGNOSIS — C3490 Malignant neoplasm of unspecified part of unspecified bronchus or lung: Secondary | ICD-10-CM | POA: Insufficient documentation

## 2013-03-17 DIAGNOSIS — Z510 Encounter for antineoplastic radiation therapy: Secondary | ICD-10-CM | POA: Insufficient documentation

## 2013-03-17 NOTE — Progress Notes (Signed)
Complex simulation/treatment planning note: The patient was taken to the CT simulator. She was placed supine. A body fix immobilization device was constructed. An abdominal compression panel was employed. She was then scanned and 4D. I contoured her ITV and the MIP image projections. These were then reviewed and 4D. She is now ready for 3-D simulation. I anticipate delivering 54 Gy in 3 sessions with dynamic conformal arcs.

## 2013-03-18 ENCOUNTER — Encounter (INDEPENDENT_AMBULATORY_CARE_PROVIDER_SITE_OTHER): Payer: Medicare PPO | Admitting: Adult Health

## 2013-03-18 ENCOUNTER — Encounter: Payer: Self-pay | Admitting: Adult Health

## 2013-03-18 ENCOUNTER — Encounter (INDEPENDENT_AMBULATORY_CARE_PROVIDER_SITE_OTHER): Payer: Self-pay

## 2013-03-18 NOTE — Progress Notes (Signed)
HPI: Alicia Davidson is an 80 year old patient of Dr. Domenic Polite where following for ongoing assessment and management of hypertension, diastolic dysfunction, history of reduced systolic function with an EF of 45%, other history to include COPD, hypercholesterolemia, and cancer. She was last seen in our office on 02/18/2013 posthospitalization in the setting of near syncope. It was noted during workup at that time that she had a 2.6 cm lung nodule in the left lower lung. She had the biopsy on 02/18/2012.   On last visit she was hypotensive, and had not been eating and drinking well since biopsy of her lung nodule. I held her Lasix for 2 days as I suspect his dehydration. The patient is here for followup for status and need to adjust medicines if necessary.   She states that the lung lesion was found to be positive for CA. She is now undergoing radiation therapy per Dr. Valere Dross and is planned for 3-5 treatments.    She is otherwise fatigued and a little stressed about all of the recent developments concerning her lungs.      No Known Allergies  Current Outpatient Prescriptions  Medication Sig Dispense Refill  . Ascorbic Acid (VITAMIN C) 1000 MG tablet Take 1,000 mg by mouth 2 (two) times daily.      Marland Kitchen aspirin 81 MG tablet Take 81 mg by mouth daily.      Marland Kitchen atorvastatin (LIPITOR) 20 MG tablet Take 20 mg by mouth at bedtime.      . carvedilol (COREG) 6.25 MG tablet Take 1 tablet (6.25 mg total) by mouth 2 (two) times daily.  60 tablet  6  . digoxin (LANOXIN) 0.125 MG tablet Take 1 tablet (0.125 mg total) by mouth daily.  30 tablet  6  . folic acid (FOLVITE) 742 MCG tablet Take 400 mcg by mouth daily.      . furosemide (LASIX) 20 MG tablet Take 20 mg by mouth daily.      Marland Kitchen ipratropium-albuterol (DUONEB) 0.5-2.5 (3) MG/3ML SOLN Take 3 mLs by nebulization every 6 (six) hours as needed (shortness of breath).       Marland Kitchen levalbuterol (XOPENEX HFA) 45 MCG/ACT inhaler Inhale 2 puffs into the lungs every 6 (six)  hours as needed for shortness of breath.       . Multiple Vitamins-Minerals (CENTRUM SILVER) tablet Take 1 tablet by mouth daily.      Marland Kitchen omeprazole (PRILOSEC OTC) 20 MG tablet Take 40 mg by mouth daily.      . potassium chloride SA (K-DUR,KLOR-CON) 20 MEQ tablet Take 1 tablet (20 mEq total) by mouth daily.  30 tablet  6  . valsartan (DIOVAN) 160 MG tablet Take 160 mg by mouth daily.       No current facility-administered medications for this visit.    Past Medical History  Diagnosis Date  . COPD (chronic obstructive pulmonary disease)   . Essential hypertension, benign   . Hypercholesteremia   . Osteoporosis   . B12 deficiency   . Anemia associated with acute blood loss   . Gastric ulcer with hemorrhage but without obstruction   . Weight loss, non-intentional   . Anxiety   . Umbilical hernia   . Gastritis     Mild  . Chronic systolic heart failure     Diagnosed [place] March 2014  . Cancer   . Cardiomyopathy     Probable nonischemic, LVEF 40-45%  . Lung cancer 02/17/13    left lower lobe    Past Surgical  History  Procedure Laterality Date  . Lung biopsy Left 02/17/13    LLL  . Tubal ligation      ROS: PHYSICAL EXAM BP 167/65  Pulse 72  Ht 4\' 11"  (1.499 m)  Wt 142 lb (64.411 kg)  BMI 28.67 kg/m2  EKG:  ASSESSMENT AND PLAN

## 2013-03-18 NOTE — Progress Notes (Deleted)
Name: Alicia Davidson    DOB: 02/16/1930  Age: 80 y.o.  MR#: [id number]       PCP:  Alonza Bogus, MD      Insurance: Payor: [employer] MEDICARE / Plan: [employer] / Product Type: *No Product type* /   CC:    Chief Complaint  Patient presents with  . Congestive Heart Failure  . Hypertension    VS Filed Vitals:   03/18/13 1442  BP: 167/65  Pulse: 72  Height: 4\' 11"  (1.499 m)  Weight: 142 lb (64.411 kg)    Weights Current Weight  03/18/13 142 lb (64.411 kg)  03/16/13 140 lb 14.4 oz (63.912 kg)  03/09/13 139 lb (63.05 kg)    Blood Pressure  BP Readings from Last 3 Encounters:  03/18/13 167/65  03/16/13 96/63  03/09/13 178/84     Admit date:  (Not on file) Last encounter with RMR:  02/18/2013   Allergy Review of patient's allergies indicates no known allergies.  Current Outpatient Prescriptions  Medication Sig Dispense Refill  . Ascorbic Acid (VITAMIN C) 1000 MG tablet Take 1,000 mg by mouth 2 (two) times daily.      Marland Kitchen aspirin 81 MG tablet Take 81 mg by mouth daily.      Marland Kitchen atorvastatin (LIPITOR) 20 MG tablet Take 20 mg by mouth at bedtime.      . carvedilol (COREG) 6.25 MG tablet Take 1 tablet (6.25 mg total) by mouth 2 (two) times daily.  60 tablet  6  . digoxin (LANOXIN) 0.125 MG tablet Take 1 tablet (0.125 mg total) by mouth daily.  30 tablet  6  . folic acid (FOLVITE) 353 MCG tablet Take 400 mcg by mouth daily.      . furosemide (LASIX) 20 MG tablet Take 20 mg by mouth daily.      Marland Kitchen ipratropium-albuterol (DUONEB) 0.5-2.5 (3) MG/3ML SOLN Take 3 mLs by nebulization every 6 (six) hours as needed (shortness of breath).       Marland Kitchen levalbuterol (XOPENEX HFA) 45 MCG/ACT inhaler Inhale 2 puffs into the lungs every 6 (six) hours as needed for shortness of breath.       . Multiple Vitamins-Minerals (CENTRUM SILVER) tablet Take 1 tablet by mouth daily.      Marland Kitchen omeprazole (PRILOSEC OTC) 20 MG tablet Take 40 mg by mouth daily.      . potassium chloride SA (K-DUR,KLOR-CON) 20 MEQ  tablet Take 1 tablet (20 mEq total) by mouth daily.  30 tablet  6  . valsartan (DIOVAN) 160 MG tablet Take 160 mg by mouth daily.       No current facility-administered medications for this visit.    Discontinued Meds:   There are no discontinued medications.  Patient Active Problem List   Diagnosis Date Noted  . Lung cancer   . Syncope 01/26/2013  . Near syncope 01/26/2013  . Squamous cell carcinoma lung 01/26/2013  . HTN (hypertension) 01/26/2013  . Diastolic dysfunction, left ventricle 01/26/2013  . Chronic combined systolic and diastolic heart failure 29/92/4268  . Mitral regurgitation 05/11/2012  . Dyspnea 08/27/2011  . COPD GOLD II 08/27/2011  . Smoking 08/27/2011  . Anemia 08/27/2011  . Essential hypertension, benign 08/27/2011    LABS    Component Value Date/Time   NA 140 01/27/2013 0540   NA 140 01/26/2013 1625   NA 139 07/07/2012 1000   K 3.9 01/27/2013 0540   K 4.3 01/26/2013 1625   K 4.9 07/07/2012 1000   CL 103 01/27/2013  0540   CL 100 01/26/2013 1625   CL 101 07/07/2012 1000   CO2 24 01/27/2013 0540   CO2 29 01/26/2013 1625   CO2 28 07/07/2012 1000   GLUCOSE 125* 01/27/2013 0540   GLUCOSE 120* 01/26/2013 1625   GLUCOSE 83 07/07/2012 1000   BUN 21 01/27/2013 0540   BUN 27* 01/26/2013 1625   BUN 26* 07/07/2012 1000   CREATININE 0.86 01/27/2013 0540   CREATININE 1.11* 01/26/2013 1625   CREATININE 1.05 07/07/2012 1000   CREATININE 1.11* 05/21/2012 0250   CREATININE 0.9 08/27/2011 1231   CALCIUM 8.7 01/27/2013 0540   CALCIUM 9.6 01/26/2013 1625   CALCIUM 9.4 07/07/2012 1000   GFRNONAA 62* 01/27/2013 0540   GFRNONAA 46* 01/26/2013 1625   GFRAA 72* 01/27/2013 0540   GFRAA 53* 01/26/2013 1625   CMP     Component Value Date/Time   NA 140 01/27/2013 0540   K 3.9 01/27/2013 0540   CL 103 01/27/2013 0540   CO2 24 01/27/2013 0540   GLUCOSE 125* 01/27/2013 0540   BUN 21 01/27/2013 0540   CREATININE 0.86 01/27/2013 0540   CREATININE 1.05 07/07/2012 1000   CALCIUM 8.7 01/27/2013 0540   PROT 6.7  01/27/2013 0540   ALBUMIN 3.3* 01/27/2013 0540   AST 22 01/27/2013 0540   ALT 25 01/27/2013 0540   ALKPHOS 75 01/27/2013 0540   BILITOT 0.2* 01/27/2013 0540   GFRNONAA 62* 01/27/2013 0540   GFRAA 72* 01/27/2013 0540       Component Value Date/Time   WBC 7.6 02/17/2013 0731   WBC 8.4 01/27/2013 0540   WBC 8.7 01/26/2013 1625   HGB 12.5 02/17/2013 0731   HGB 10.5* 01/27/2013 0540   HGB 11.6* 01/26/2013 1625   HCT 37.6 02/17/2013 0731   HCT 32.2* 01/27/2013 0540   HCT 35.7* 01/26/2013 1625   MCV 91.7 02/17/2013 0731   MCV 93.1 01/27/2013 0540   MCV 93.7 01/26/2013 1625    Lipid Panel  No results found for this basename: chol, trig, hdl, cholhdl, vldl, ldlcalc    ABG    Component Value Date/Time   PHART 7.444 03/15/2013 1605   PCO2ART 34.5* 03/15/2013 1605   PO2ART 79.1* 03/15/2013 1605   HCO3 23.3 03/15/2013 1605   TCO2 21.2 03/15/2013 1605   ACIDBASEDEF 0.3 03/15/2013 1605   O2SAT 95.8 03/15/2013 1605     Lab Results  Component Value Date   TSH 0.96 08/27/2011   BNP (last 3 results)  Recent Labs  01/26/13 1625  PROBNP 621.9*   Cardiac Panel (last 3 results) No results found for this basename: CKTOTAL, CKMB, TROPONINI, RELINDX,  in the last 72 hours  Iron/TIBC/Ferritin No results found for this basename: iron, tibc, ferritin     EKG Orders placed during the hospital encounter of 01/26/13  . ED EKG  . ED EKG  . EKG 12-LEAD  . EKG 12-LEAD  . EKG     Prior Assessment and Plan Problem List as of 03/18/2013     Cardiovascular and Mediastinum   Essential hypertension, benign   Last Assessment & Plan   02/18/2013 Office Visit Written 02/18/2013  1:39 PM by Lendon Colonel, NP     As discussed below. F/U in one month for possible adjustments in antihypertensives.    Chronic combined systolic and diastolic heart failure   Last Assessment & Plan   02/18/2013 Office Visit Written 02/18/2013  1:38 PM by Lendon Colonel, NP     Blood pressure is slightly  low today. I rechecked it  manually and this correlated with triage recordings. She was NPO for 24 hours yesterday prior to biopsy. She has not been eating or drinking well since. I suspect that she may be a little dehydrated. I will hold her lasix for two days and then resume. If BP stays low, they are to call us.   I will see her again in one month for reassessment of her BP and recommendations for adjustments in antihypertensives if necessary.    Mitral regurgitation   Last Assessment & Plan   06/16/2012 Office Visit Edited 06/16/2012 12:15 PM by Lendon Colonel, NP     Per echocardiogram the patient was found to have moderate mitral regurg, with a moderately dilated left atrium and mildly to markedly dilated right ventricle. Continue heart rate and blood pressure control.    Syncope   Near syncope   Last Assessment & Plan   02/18/2013 Office Visit Written 02/18/2013  1:40 PM by Lendon Colonel, NP     Carotid ultrasound completed without significant stenosis.    HTN (hypertension)   Diastolic dysfunction, left ventricle     Respiratory   COPD GOLD II   Last Assessment & Plan   10/20/2012 Office Visit Written 10/20/2012  2:39 PM by Lendon Colonel, NP     She is to continue follow up with pulmonologist in [place]. She will stop her HS O2 for now. Continue neb tx.    Squamous cell carcinoma lung   Lung cancer     Other   Dyspnea   Last Assessment & Plan   08/03/2012 Office Visit Written 08/03/2012  4:41 PM by Lendon Colonel, NP     This is related to chronic COPD. There is no wheezes or productive coughing on exam. No changes in her medicines.    Smoking   Last Assessment & Plan   10/14/2011 Office Visit Written 10/14/2011  3:24 PM by Tanda Rockers, MD     I reviewed the Flethcher curve with patient that basically indicates  if you quit smoking when your best day FEV1 is still well preserved (which hers is) it is highly unlikely you will progress to severe disease and informed the patient there was no  medication on the market that has proven to change the curve or the likelihood of progression.  Therefore stopping smoking and maintaining abstinence is the most important aspect of care, not choice of inhalers or for that matter, doctors.   Pulmonary f/u can be prn    Anemia   Last Assessment & Plan   08/27/2011 Office Visit Written 08/27/2011  3:22 PM by Tanda Rockers, MD       Lab 08/27/11 1231  HGB 8.6 Repeated and verified X2.*    Normocytic with recent gi w/u neg in [place], should continue Fe for now and f/u with primary        Imaging: Dg Chest 1 View  02/17/2013   CLINICAL DATA:  Left lower lobe mass.  Status post needle biopsy.  EXAM: CHEST - 1 VIEW  COMPARISON:  Chest x-ray dated 01/26/2013  FINDINGS: There is no pneumothorax on the right or left. The mass in the left lower lobe is not apparent on this exam. There is a ill-defined density in that area.  Lungs are hyperinflated consistent with emphysema. Heart size and pulmonary vascularity are normal. No acute osseous abnormality.  IMPRESSION: No acute abnormality. No pneumothorax. Mass in the left lower lobe is  quite indistinct.   Electronically Signed   By: Rozetta Nunnery M.D.   On: 02/17/2013 12:42   Ct Biopsy  02/17/2013   CLINICAL DATA:  Hypermetabolic left lower lobe lung nodule, no evidence of metastases on PET-CT.  EXAM: CT GUIDED CORE BIOPSY OF LEFT LOWER LOBE LUNG NODULE  ANESTHESIA/SEDATION: Intravenous Fentanyl and Versed were administered as conscious sedation during continuous cardiorespiratory monitoring by the radiology RN, with a total moderate sedation time of 13 minutes.  PROCEDURE: The procedure risks, benefits, and alternatives were explained to the patient. Questions regarding the procedure were encouraged and answered. The patient understands and consents to the procedure.  The patient was placed in left lateral decubitus positioning and limited axial scans through the lower chest obtained. The lesion was localized  and an appropriate skin entry site determined.  The left posterior chest was prepped with Betadinein a sterile fashion, and a sterile drape was applied covering the operative field. A sterile gown and sterile gloves were used for the procedure. Local anesthesia was provided with 1% Lidocaine.  Under CT fluoroscopic guidance, a 17 gauge trocar needle was advanced to the margin of the lesion. Once needle tip position was confirmed, coaxial 18-gauge core biopsy samples were obtained, submitted in formalin to surgical pathology. The guide needle was removed. Postprocedure scans demonstrate a small amount of regional alveolar hemorrhage. No pneumothorax.  Complications: None. The patient tolerated the procedure well.  FINDINGS: Lobular left lower lobe nodule is again demonstrated. Percutaneous core needle biopsy performed as above.  IMPRESSION: 1. Technically successful CT-guided core biopsy of left lower lobe lung nodule. Observation and follow-up chest radiography is planned.   Electronically Signed   By: Arne Cleveland M.D.   On: 02/17/2013 13:24

## 2013-03-18 NOTE — Patient Instructions (Addendum)
Your physician wants you to follow-up in: 4 months with Dr.McDowell You will receive a reminder letter in the mail two months in advance. If you don't receive a letter, please call our office to schedule the follow-up appointment.    Your physician recommends that you continue on your current medications as directed. Please refer to the Current Medication list given to you today.     Thank you for choosing [facility] !

## 2013-03-23 ENCOUNTER — Encounter: Payer: Self-pay | Admitting: Radiation Oncology

## 2013-03-23 NOTE — Progress Notes (Signed)
  Radiation Oncology         (336) [phone] ________________________________  Name: Alicia Davidson MRN: [id number]  Date: 03/23/2013  DOB: 12-04-1930  RESPIRATORY MOTION MANAGEMENT SIMULATION  NARRATIVE:  In order to account for effect of respiratory motion on target structures and other organs in the planning and delivery of radiotherapy, this patient underwent respiratory motion management simulation.  To accomplish this, when the patient was brought to the CT simulation planning suite, 4D respiratoy motion management CT images were obtained.  The CT images were loaded into the planning software.  Then, using a variety of tools including Cine, MIP, and standard views, the target volume and planning target volumes (PTV) were delineated.  Avoidance structures were contoured.  Treatment planning then occurred.  Dose volume histograms were generated and reviewed for each of the requested structure.  The resulting plan was carefully reviewed and approved today.

## 2013-03-23 NOTE — Progress Notes (Signed)
3-D simulation note: The patient completed 3-D simulation for her stereotactic radiosurgery to her left lung in the management of her non-small cell carcinoma. She is planned with 2 dynamic arcs representing 2 complex treatment devices. Dose volume histograms were obtained for the target structures and also avoidance structures including the chest wall, aorta, lungs, and spinal cord. We met our departmental guidelines. I prescribing 5400 cGy in 3 sessions utilizing 6 MV photons. I am requesting daily cone beam CT for image guidance.

## 2013-03-24 NOTE — Procedures (Signed)
NAME:  Alicia Davidson, Alicia Davidson                 ACCOUNT NO.:  [id number]  MEDICAL RECORD NO.:  [id number]  LOCATION:                                 FACILITY:  PHYSICIAN:  Emmaline Wahba L. Luan Pulling, M.D.DATE OF BIRTH:  Mar 20, 1930  DATE OF PROCEDURE:  03/23/2013 DATE OF DISCHARGE:                           PULMONARY FUNCTION TEST   1. Spirometry shows a mild ventilatory defect with evidence of airflow     obstruction at the smaller airways. 2. Lung volumes show mild restrictive change. 3. Airway resistance is elevated confirming the presence of airflow     obstruction. 4. DLCO is severely reduced. 5. Arterial blood gas is normal. 6. This study is consistent with the clinical diagnosis of COPD.     Shanautica Forker L. Luan Pulling, M.D.     ELH/MEDQ  D:  03/23/2013  T:  03/24/2013  Job:  [id number]

## 2013-03-29 ENCOUNTER — Ambulatory Visit
Admission: RE | Admit: 2013-03-29 | Discharge: 2013-03-29 | Disposition: A | Discharge status: Home / Self Care | Payer: Medicare HMO | Source: Ambulatory Visit | Attending: Radiation Oncology | Admitting: Radiation Oncology

## 2013-03-29 ENCOUNTER — Ambulatory Visit: Payer: Medicare HMO | Admitting: Radiation Oncology

## 2013-03-29 ENCOUNTER — Encounter: Payer: Self-pay | Admitting: Radiation Oncology

## 2013-03-29 VITALS — BP 175/83 | HR 71 | Temp 97.7°F | Resp 20 | Wt 141.2 lb

## 2013-03-29 VITALS — BP 175/83 | HR 71 | Temp 97.7°F | Resp 20

## 2013-03-29 DIAGNOSIS — C3490 Malignant neoplasm of unspecified part of unspecified bronchus or lung: Secondary | ICD-10-CM

## 2013-03-29 NOTE — Progress Notes (Signed)
Weekly Management Note:  Site: Left lung Current Dose:  1800  cGy Projected Dose: 5400  cGy  Narrative: The patient is seen today for routine under treatment assessment. CBCT/MVCT images/port films were reviewed. The chart was reviewed.   Setup is excellent. She is without new complaints today.  Physical Examination:  Filed Vitals:   03/29/13 1446  BP: 175/83  Pulse: 71  Temp: 97.7 F (36.5 C)  Resp: 20  .  Weight:  . Lungs are clear.  Impression: Tolerating radiation therapy well.  Plan: Continue radiation therapy as planned.

## 2013-03-29 NOTE — Progress Notes (Signed)
  Radiation Oncology         [phone] ________________________________  Name: Alicia Davidson MRN: [id number]  Date: 03/29/2013  DOB: Mar 12, 1930  Stereotactic Body Radiotherapy Treatment Procedure Note  NARRATIVE:  Alicia Davidson was brought to the stereotactic radiation treatment machine and placed supine on the CT couch. The patient was set up for stereotactic body radiotherapy on the body fix pillow.  3D TREATMENT PLANNING AND DOSIMETRY:  The patient's radiation plan was reviewed and approved prior to starting treatment.  It showed 3-dimensional radiation distributions overlaid onto the planning CT.  The [facility] for the target structures as well as the organs at risk were reviewed. The documentation of this is filed in the radiation oncology EMR.  SIMULATION VERIFICATION:  The patient underwent CT imaging on the treatment unit.  These were carefully aligned to document that the ablative radiation dose would cover the left lung target volume and maximally spare the nearby organs at risk according to the planned distribution.  SPECIAL TREATMENT PROCEDURE: Alicia Davidson received high dose ablative stereotactic body radiotherapy to the planned target volume without unforeseen complications. Treatment was delivered uneventfully. She received her first dose of 1800 cGy of a prescribed 5400 cGy in 3 sessions. The high doses associated with stereotactic body radiotherapy and the significant potential risks require careful treatment set up and patient monitoring constituting a special treatment procedure   STEREOTACTIC TREATMENT MANAGEMENT:  Following delivery, the patient was evaluated clinically. The patient tolerated treatment without significant acute effects, and was discharged to home in stable condition.    PLAN: Continue treatment as planned.  ------------------------------------------------        Rexene Edison, MD

## 2013-03-29 NOTE — Progress Notes (Signed)
Pt denies pain, loss of appetite, fatigue. She has dry cough, SOB w/exertion. Reviewed temporary side effect of fatigue w/pt and daughter. Discussed importance of protein in her diet and food/drink sources. Pt and daughter verbalized understanding.

## 2013-03-31 ENCOUNTER — Ambulatory Visit
Admission: RE | Admit: 2013-03-31 | Discharge: 2013-03-31 | Disposition: A | Discharge status: Home / Self Care | Payer: Medicare HMO | Source: Ambulatory Visit | Attending: Radiation Oncology | Admitting: Radiation Oncology

## 2013-03-31 DIAGNOSIS — C3490 Malignant neoplasm of unspecified part of unspecified bronchus or lung: Secondary | ICD-10-CM

## 2013-03-31 NOTE — Progress Notes (Signed)
  Radiation Oncology         (336) [phone] ________________________________  Name: Charne Mcbrien MRN: [id number]  Date: 03/31/2013  DOB: 01-Jan-1931  Stereotactic Body Radiotherapy Treatment Procedure Note  NARRATIVE:  Kaleia Longhi was brought to the stereotactic radiation treatment machine and placed supine on the CT couch. The patient was set up for stereotactic body radiotherapy on the body fix pillow.  3D TREATMENT PLANNING AND DOSIMETRY:  The patient's radiation plan was reviewed and approved prior to starting treatment.  It showed 3-dimensional radiation distributions overlaid onto the planning CT.  The [facility] for the target structures as well as the organs at risk were reviewed. The documentation of this is filed in the radiation oncology EMR.  SIMULATION VERIFICATION:  The patient underwent CT imaging on the treatment unit.  These were carefully aligned to document that the ablative radiation dose would cover the target volume and maximally spare the nearby organs at risk according to the planned distribution.  SPECIAL TREATMENT PROCEDURE: Stanton Kidney Medico received high dose ablative stereotactic body radiotherapy to the planned target volume without unforeseen complications. Treatment was delivered uneventfully. She received her second fraction of 1800 cGy, and thus far has received 3600 cGy of a prescribed 5400 cGy in 3 sessions. The high doses associated with stereotactic body radiotherapy and the significant potential risks require careful treatment set up and patient monitoring constituting a special treatment procedure   STEREOTACTIC TREATMENT MANAGEMENT:  Following delivery, the patient was evaluated clinically. The patient tolerated treatment without significant acute effects, and was discharged to home in stable condition.    PLAN: Continue treatment as planned.  ------------------------------------------------        Rexene Edison, MD

## 2013-04-05 ENCOUNTER — Ambulatory Visit
Admission: RE | Admit: 2013-04-05 | Discharge: 2013-04-05 | Disposition: A | Discharge status: Home / Self Care | Payer: Medicare HMO | Source: Ambulatory Visit | Attending: Radiation Oncology | Admitting: Radiation Oncology

## 2013-04-10 ENCOUNTER — Encounter: Payer: Self-pay | Admitting: Radiation Oncology

## 2013-04-10 NOTE — Progress Notes (Signed)
[facility] End of Treatment Note  Name:Alicia Davidson  Date: 04/10/2013 QIO:[id number] DOB:Feb 06, 1930   Status:outpatient    CC: Alonza Bogus, MD  Dr. Modesto Charon, Dr. Matthias Hughs  REFERRING PHYSICIAN:   Dr. Modesto Charon   DIAGNOSIS:  Stage I A. (T1b, N0, M0) squamous cell carcinoma of the left lung  INDICATION FOR TREATMENT: Curative   TREATMENT DATES: 03/29/2013 through 04/05/2013                          SITE/DOSE: Left lower lobe 5400 cGy in 3 sessions                           BEAMS/ENERGY: 6 MV photons, 2 dynamic arcs                  NARRATIVE:  The patient tolerated treatment well with no acute toxicity during her course of therapy.                          PLAN: Routine followup in one month. Patient instructed to call if questions or worsening complaints in interim.

## 2013-05-05 ENCOUNTER — Encounter: Payer: Self-pay | Admitting: *Deleted

## 2013-05-06 ENCOUNTER — Telehealth: Payer: Self-pay | Admitting: *Deleted

## 2013-05-06 ENCOUNTER — Encounter: Payer: Self-pay | Admitting: Radiation Oncology

## 2013-05-06 ENCOUNTER — Ambulatory Visit
Admission: RE | Admit: 2013-05-06 | Discharge: 2013-05-06 | Disposition: A | Discharge status: Home / Self Care | Payer: Medicare HMO | Source: Ambulatory Visit | Attending: Radiation Oncology | Admitting: Radiation Oncology

## 2013-05-06 VITALS — BP 134/64 | HR 75 | Temp 98.2°F | Resp 22 | Wt 141.8 lb

## 2013-05-06 DIAGNOSIS — C3490 Malignant neoplasm of unspecified part of unspecified bronchus or lung: Secondary | ICD-10-CM

## 2013-05-06 NOTE — Progress Notes (Signed)
CC: Dr. Velvet Bathe, Dr. Modesto Charon  Followup note:  Alicia Davidson returns today approximately 1 month following completion of SBRT in the management of her T1b squamous cell carcinoma of the left lung. She is without complaints today. She does not note any change in her dyspnea on exertion since completion of her radiation therapy. She tells me that Dr. Luan Pulling will continue as her primary care physician as well as being her pulmonologist.  Physical examination: She looks well. She is in no respiratory distress. Filed Vitals:   05/06/13 1245  BP: 134/64  Pulse: 75  Temp: 98.2 F (36.8 C)  Resp: 22   Nodes: There is no palpable cervical or supraclavicular lymphadenopathy. Chest: Lungs clear.  Impression: Satisfactory progress. I will repeat her CT scan in early January which will be a three-month interval scan. I will schedule this closer to home at [facility]. I'll get a BUN/creatinine  prior to her scan in early July.  Plan: As above. I will see her for a followup visit following her CT scan in early July.

## 2013-05-06 NOTE — Progress Notes (Signed)
Pt denies pain, fatigue, loss of appetite. She states that after about 1 week of completing radiation therapy she coughed up a large amount of yellow mucus but none since that time. She has SOB w/minimal activity. She does not use O 2 at home. Pt states "I feel good."

## 2013-05-06 NOTE — Telephone Encounter (Signed)
Called patient to inform of lab, test and fu visit, spoke with patient and she is aware of these appts.

## 2013-07-08 ENCOUNTER — Ambulatory Visit (HOSPITAL_COMMUNITY)
Admission: RE | Admit: 2013-07-08 | Discharge: 2013-07-08 | Disposition: A | Discharge status: Home / Self Care | Payer: Medicare PPO | Source: Ambulatory Visit | Attending: Radiation Oncology | Admitting: Radiation Oncology

## 2013-07-08 ENCOUNTER — Encounter (HOSPITAL_COMMUNITY): Payer: Self-pay

## 2013-07-08 DIAGNOSIS — C3490 Malignant neoplasm of unspecified part of unspecified bronchus or lung: Secondary | ICD-10-CM

## 2013-07-08 DIAGNOSIS — Z923 Personal history of irradiation: Secondary | ICD-10-CM | POA: Insufficient documentation

## 2013-07-08 LAB — POCT I-STAT CREATININE: CREATININE: 1.3 mg/dL — AB (ref 0.50–1.10)

## 2013-07-08 MED ORDER — IOHEXOL 300 MG/ML  SOLN
80.0000 mL | Freq: Once | INTRAMUSCULAR | Status: AC | PRN
  Administered 2013-07-08: 80 mL via INTRAVENOUS

## 2013-07-14 ENCOUNTER — Other Ambulatory Visit: Payer: Self-pay | Admitting: Adult Health

## 2013-07-16 ENCOUNTER — Encounter: Payer: Self-pay | Admitting: *Deleted

## 2013-07-19 ENCOUNTER — Telehealth: Payer: Self-pay | Admitting: Radiation Oncology

## 2013-07-19 NOTE — Telephone Encounter (Signed)
Spoke w/pt daughter, Carlyon Shadow, about reminder appt tomorrow at 11:40a w/Dr. Valere Dross. She was agreeable. (lw)

## 2013-07-20 ENCOUNTER — Encounter: Payer: Self-pay | Admitting: Radiation Oncology

## 2013-07-20 ENCOUNTER — Ambulatory Visit
Admission: RE | Admit: 2013-07-20 | Discharge: 2013-07-20 | Disposition: A | Discharge status: Home / Self Care | Payer: Medicare HMO | Source: Ambulatory Visit | Attending: Radiation Oncology | Admitting: Radiation Oncology

## 2013-07-20 VITALS — BP 196/80 | HR 72 | Temp 98.3°F | Resp 22 | Ht 59.0 in | Wt 142.4 lb

## 2013-07-20 DIAGNOSIS — C3432 Malignant neoplasm of lower lobe, left bronchus or lung: Secondary | ICD-10-CM

## 2013-07-20 NOTE — Progress Notes (Signed)
CC: Dr. 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 She is without complaints today except for dyspnea on exertion. Her followup CT scan on 07/08/2013 showed interval decrease in the size of her left lower lobe primary that measured 4.5 x 12.5 mm compared to 26 x 20 mm. There was no mediastinal or hilar lymphadenopathy.  Physical examination: Alert and oriented. No respiratory distress. Filed Vitals:   07/20/13 1137  BP: 196/80  Pulse: 72  Temp: 98.3 F (36.8 C)  Resp: 22   O2 sat 99% on room air Nodes: There is no palpable cervical or supraclavicular lymphadenopathy. Chest: Lungs clear.  Impression: Satisfactory progress. She'll see Dr. Luan Pulling for a followup visit later this summer.  Plan: Followup visit with me in approximately 6 months. I will obtain a chest x-ray that time if she has not had one by early next year.

## 2013-07-20 NOTE — Progress Notes (Signed)
Follow up  LLL lung rad txs 04/05/13-04/05/13, CT Chest results from 07/08/13 here, patient is sob with ambulation, ,rr=22, 99% room air sats, congested this am, coughs up clear phelgm, appetite good, energy level bad, tires easily,  11:45 AM

## 2013-07-28 ENCOUNTER — Other Ambulatory Visit: Payer: Self-pay | Admitting: Adult Health

## 2013-08-11 ENCOUNTER — Other Ambulatory Visit: Payer: Self-pay | Admitting: Adult Health

## 2013-09-01 ENCOUNTER — Encounter: Payer: Self-pay | Admitting: Cardiology

## 2013-09-01 ENCOUNTER — Ambulatory Visit (INDEPENDENT_AMBULATORY_CARE_PROVIDER_SITE_OTHER): Payer: Medicare HMO | Admitting: Cardiology

## 2013-09-01 VITALS — BP 154/70 | HR 84 | Ht 59.0 in | Wt 138.0 lb

## 2013-09-01 DIAGNOSIS — C3490 Malignant neoplasm of unspecified part of unspecified bronchus or lung: Secondary | ICD-10-CM

## 2013-09-01 DIAGNOSIS — J390 Retropharyngeal and parapharyngeal abscess: Secondary | ICD-10-CM

## 2013-09-01 DIAGNOSIS — J438 Other emphysema: Secondary | ICD-10-CM

## 2013-09-01 DIAGNOSIS — I428 Other cardiomyopathies: Secondary | ICD-10-CM

## 2013-09-01 DIAGNOSIS — I10 Essential (primary) hypertension: Secondary | ICD-10-CM

## 2013-09-01 DIAGNOSIS — J439 Emphysema, unspecified: Secondary | ICD-10-CM

## 2013-09-01 DIAGNOSIS — C3492 Malignant neoplasm of unspecified part of left bronchus or lung: Secondary | ICD-10-CM

## 2013-09-01 MED ORDER — CARVEDILOL 6.25 MG PO TABS: 6.2500 mg | ORAL_TABLET | Freq: Two times a day (BID) | ORAL | Status: DC

## 2013-09-01 NOTE — Assessment & Plan Note (Signed)
Last LVEF 40-45% as of October 2014. She does have coronary atherosclerosis defined by chest CT imaging, however no ischemia by Cardiolite last year. Plan is to continue medical therapy, followup echocardiogram to reassess LV function. Refill provided for Coreg.

## 2013-09-01 NOTE — Assessment & Plan Note (Signed)
Keep followup with pulmonary.

## 2013-09-01 NOTE — Assessment & Plan Note (Signed)
No change in current regimen. Limit salt in the diet, activity as tolerated. Keep followup with Dr. Luan Pulling.

## 2013-09-01 NOTE — Progress Notes (Signed)
Clinical Summary Alicia Davidson is an 80 y.o.female last seen by Ms. Lawrence NP back in February. Interval records reviewed, diagnosis of squamous cell carcinoma of the lung after finding of a 2.6 cm nodule in the left lower lobe, stage I by PET scan. She was seen by Dr. Roxan Hockey who had significant reservations about performing lung resection surgery. Patient was to followup with me to discuss cardiac evaluation, however this never occurred. I see that she was evaluated by radiation oncology and underwent SBRT. Interval chest CT scan showed decrease in size of left lower lobe cancer with no mediastinal or hilar lymphadenopathy.  She presents today stating that overall she has been doing reasonably well. Weights have been stable. She does have fluctuations in blood pressure with systolic ranging from 935 up to 170. She reports compliance with her medications. No angina symptoms. Chronic, stable shortness of breath, no palpitations. She needed a refill on Coreg. Plan is to see Dr. Luan Pulling in the next few months.  Echocardiogram from October 2014 showed mild to moderate LVH with LVEF 70-17%, grade 1 diastolic dysfunction, sclerotic aortic valve, mild mitral regurgitation, severe left atrial enlargement, mildly reduced RV contraction, PASP 33 mm mercury. Ischemic testing in June 2014 via Cardiolite showed global LV hypokinesis with overall normal perfusion arguing against ischemic heart disease.   No Known Allergies  Current Outpatient Prescriptions  Medication Sig Dispense Refill  . Ascorbic Acid (VITAMIN C) 1000 MG tablet Take 1,000 mg by mouth 2 (two) times daily.      Marland Kitchen aspirin 81 MG tablet Take 81 mg by mouth daily.      Marland Kitchen atorvastatin (LIPITOR) 20 MG tablet Take 20 mg by mouth at bedtime.      . calcium carbonate (OS-CAL) 600 MG TABS tablet Take 600 mg by mouth daily.      . carvedilol (COREG) 6.25 MG tablet Take 1 tablet (6.25 mg total) by mouth 2 (two) times daily.  60 tablet  6  .  digoxin (LANOXIN) 0.125 MG tablet Take 1 tablet (0.125 mg total) by mouth daily.  30 tablet  6  . Fe Bisgly-Succ-C-Thre-B12-FA (IRON 21/7 PO) Take 65 mg elemental calcium/kg/hr by mouth 2 (two) times daily.      . folic acid (FOLVITE) 793 MCG tablet Take 400 mcg by mouth daily.      . furosemide (LASIX) 20 MG tablet Take 20 mg by mouth daily.      . furosemide (LASIX) 20 MG tablet TAKE TWO TABLETS BY MOUTH ONCE DAILY  60 tablet  3  . ipratropium-albuterol (DUONEB) 0.5-2.5 (3) MG/3ML SOLN Take 3 mLs by nebulization every 6 (six) hours as needed (shortness of breath).       Marland Kitchen KLOR-CON M20 20 MEQ tablet TAKE ONE TABLET BY MOUTH ONCE DAILY  30 tablet  0  . levalbuterol (XOPENEX HFA) 45 MCG/ACT inhaler Inhale 2 puffs into the lungs every 6 (six) hours as needed for shortness of breath.       . Multiple Vitamins-Minerals (CENTRUM SILVER) tablet Take 1 tablet by mouth daily.      Marland Kitchen omeprazole (PRILOSEC OTC) 20 MG tablet Take 40 mg by mouth daily.      . valsartan (DIOVAN) 160 MG tablet Take 160 mg by mouth daily.       No current facility-administered medications for this visit.    Past Medical History  Diagnosis Date  . COPD (chronic obstructive pulmonary disease)   . Essential hypertension, benign   . Hypercholesteremia   .  Osteoporosis   . B12 deficiency   . Anemia associated with acute blood loss   . Gastric ulcer with hemorrhage but without obstruction   . Weight loss, non-intentional   . Anxiety   . Umbilical hernia   . Gastritis     Mild  . Chronic systolic heart failure     Diagnosed [place] March 2014  . Cancer   . Cardiomyopathy     Probable nonischemic, LVEF 40-45%  . Lung cancer 02/17/13    left lower lobe  . Hx of radiation therapy 03/29/13- 04/05/13    SBRT- LLL lung 5400 cGy in 3 sessions    Social History Ms. Secrest reports that she quit smoking about 19 months ago. Her smoking use included Cigarettes. She has a 16.8 pack-year smoking history. She has never used  smokeless tobacco. Ms. Mirando reports that she does not drink alcohol.  Review of Systems Other systems reviewed and negative except as outlined.  Physical Examination Filed Vitals:   09/01/13 1050  BP: 154/70  Pulse: 84   Filed Weights   09/01/13 1050  Weight: 138 lb (62.596 kg)    Elderly woman in no acute distress.  HEENT: Conjunctiva and lids normal, oropharynx clear.  Neck: Supple, no elevated JVP, no thyromegaly.  Lungs: Diminished but clear to auscultation, nonlabored breathing at rest.  Cardiac: Distant regular rate and rhythm, no S3, 2/6 apical systolic murmur, no pericardial rub.  Abdomen: Soft, nontender, bowel sounds present, no guarding or rebound.  Extremities: No pitting edema, distal pulses 1-2+.  Skin: Warm and dry.  Musculoskeletal: Kyphosis noted.  Neuropsychiatric: Alert and oriented x3, affect grossly appropriate.   Problem List and Plan   Nonischemic cardiomyopathy Last LVEF 40-45% as of October 2014. She does have coronary atherosclerosis defined by chest CT imaging, however no ischemia by Cardiolite last year. Plan is to continue medical therapy, followup echocardiogram to reassess LV function. Refill provided for Coreg.  Essential hypertension, benign No change in current regimen. Limit salt in the diet, activity as tolerated. Keep followup with Dr. Luan Pulling.  Squamous cell carcinoma lung History reviewed above.  COPD GOLD II Keep followup with pulmonary.    Satira Sark, M.D., F.A.C.C.

## 2013-09-01 NOTE — Assessment & Plan Note (Signed)
History reviewed above.

## 2013-09-01 NOTE — Patient Instructions (Signed)
Your physician recommends that you schedule a follow-up appointment in: 3 months with Dr. Domenic Polite  Your physician recommends that you continue on your current medications as directed. Please refer to the Current Medication list given to you today.  Your physician has requested that you have an echocardiogram. Echocardiography is a painless test that uses sound waves to create images of your heart. It provides your doctor with information about the size and shape of your heart and how well your heart's chambers and valves are working. This procedure takes approximately one hour. There are no restrictions for this procedure.   I have refilled your Coreg  Thank you for choosing [facility]!!

## 2013-09-05 ENCOUNTER — Other Ambulatory Visit: Payer: Self-pay | Admitting: Cardiology

## 2013-09-06 ENCOUNTER — Ambulatory Visit (HOSPITAL_COMMUNITY)
Admission: RE | Admit: 2013-09-06 | Discharge: 2013-09-06 | Disposition: A | Discharge status: Home / Self Care | Payer: Medicare PPO | Source: Ambulatory Visit | Attending: Cardiology | Admitting: Cardiology

## 2013-09-06 DIAGNOSIS — Z923 Personal history of irradiation: Secondary | ICD-10-CM | POA: Diagnosis not present

## 2013-09-06 DIAGNOSIS — E785 Hyperlipidemia, unspecified: Secondary | ICD-10-CM | POA: Diagnosis not present

## 2013-09-06 DIAGNOSIS — Z87891 Personal history of nicotine dependence: Secondary | ICD-10-CM | POA: Insufficient documentation

## 2013-09-06 DIAGNOSIS — J449 Chronic obstructive pulmonary disease, unspecified: Secondary | ICD-10-CM | POA: Insufficient documentation

## 2013-09-06 DIAGNOSIS — J4489 Other specified chronic obstructive pulmonary disease: Secondary | ICD-10-CM | POA: Insufficient documentation

## 2013-09-06 DIAGNOSIS — I10 Essential (primary) hypertension: Secondary | ICD-10-CM | POA: Insufficient documentation

## 2013-09-06 DIAGNOSIS — I379 Nonrheumatic pulmonary valve disorder, unspecified: Secondary | ICD-10-CM

## 2013-09-06 DIAGNOSIS — Z85118 Personal history of other malignant neoplasm of bronchus and lung: Secondary | ICD-10-CM | POA: Insufficient documentation

## 2013-09-06 DIAGNOSIS — I428 Other cardiomyopathies: Secondary | ICD-10-CM | POA: Insufficient documentation

## 2013-09-06 NOTE — Progress Notes (Signed)
  Echocardiogram 2D Echocardiogram has been performed.  [place], [place] 09/06/2013, 3:44 PM

## 2013-09-25 ENCOUNTER — Emergency Department (HOSPITAL_COMMUNITY)
Admission: EM | Admit: 2013-09-25 | Discharge: 2013-09-25 | Disposition: A | Discharge status: Home / Self Care | Payer: Medicare PPO | Attending: Emergency Medicine | Admitting: Emergency Medicine

## 2013-09-25 ENCOUNTER — Encounter (HOSPITAL_COMMUNITY): Payer: Self-pay | Admitting: Emergency Medicine

## 2013-09-25 ENCOUNTER — Emergency Department (HOSPITAL_COMMUNITY): Payer: Medicare PPO

## 2013-09-25 DIAGNOSIS — Z923 Personal history of irradiation: Secondary | ICD-10-CM | POA: Diagnosis not present

## 2013-09-25 DIAGNOSIS — D649 Anemia, unspecified: Secondary | ICD-10-CM | POA: Insufficient documentation

## 2013-09-25 DIAGNOSIS — M810 Age-related osteoporosis without current pathological fracture: Secondary | ICD-10-CM | POA: Insufficient documentation

## 2013-09-25 DIAGNOSIS — K2970 Gastritis, unspecified, without bleeding: Secondary | ICD-10-CM | POA: Diagnosis not present

## 2013-09-25 DIAGNOSIS — J40 Bronchitis, not specified as acute or chronic: Secondary | ICD-10-CM

## 2013-09-25 DIAGNOSIS — E7800 Pure hypercholesterolemia, unspecified: Secondary | ICD-10-CM | POA: Insufficient documentation

## 2013-09-25 DIAGNOSIS — I10 Essential (primary) hypertension: Secondary | ICD-10-CM | POA: Diagnosis not present

## 2013-09-25 DIAGNOSIS — J441 Chronic obstructive pulmonary disease with (acute) exacerbation: Secondary | ICD-10-CM | POA: Diagnosis not present

## 2013-09-25 DIAGNOSIS — I5022 Chronic systolic (congestive) heart failure: Secondary | ICD-10-CM | POA: Insufficient documentation

## 2013-09-25 DIAGNOSIS — R05 Cough: Secondary | ICD-10-CM | POA: Insufficient documentation

## 2013-09-25 DIAGNOSIS — K2990 Gastroduodenitis, unspecified, without bleeding: Secondary | ICD-10-CM | POA: Diagnosis not present

## 2013-09-25 DIAGNOSIS — Z85118 Personal history of other malignant neoplasm of bronchus and lung: Secondary | ICD-10-CM | POA: Diagnosis not present

## 2013-09-25 DIAGNOSIS — Z79899 Other long term (current) drug therapy: Secondary | ICD-10-CM | POA: Diagnosis not present

## 2013-09-25 DIAGNOSIS — Z7982 Long term (current) use of aspirin: Secondary | ICD-10-CM | POA: Diagnosis not present

## 2013-09-25 DIAGNOSIS — R059 Cough, unspecified: Secondary | ICD-10-CM | POA: Insufficient documentation

## 2013-09-25 DIAGNOSIS — Z87891 Personal history of nicotine dependence: Secondary | ICD-10-CM | POA: Diagnosis not present

## 2013-09-25 LAB — COMPREHENSIVE METABOLIC PANEL
ALBUMIN: 3.8 g/dL (ref 3.5–5.2)
ALK PHOS: 95 U/L (ref 39–117)
ALT: 31 U/L (ref 0–35)
AST: 26 U/L (ref 0–37)
Anion gap: 13 (ref 5–15)
BILIRUBIN TOTAL: 0.3 mg/dL (ref 0.3–1.2)
BUN: 16 mg/dL (ref 6–23)
CHLORIDE: 100 meq/L (ref 96–112)
CO2: 28 mEq/L (ref 19–32)
Calcium: 9.2 mg/dL (ref 8.4–10.5)
Creatinine, Ser: 0.96 mg/dL (ref 0.50–1.10)
GFR calc Af Amer: 63 mL/min — ABNORMAL LOW (ref >90)
GFR calc non Af Amer: 54 mL/min — ABNORMAL LOW (ref >90)
Glucose, Bld: 126 mg/dL — ABNORMAL HIGH (ref 70–99)
Potassium: 4.1 mEq/L (ref 3.7–5.3)
SODIUM: 141 meq/L (ref 137–147)
Total Protein: 7.9 g/dL (ref 6.0–8.3)

## 2013-09-25 LAB — CBC WITH DIFFERENTIAL/PLATELET
BASOS ABS: 0 10*3/uL (ref 0.0–0.1)
BASOS PCT: 0 % (ref 0–1)
Eosinophils Absolute: 0.2 10*3/uL (ref 0.0–0.7)
Eosinophils Relative: 2 % (ref 0–5)
HCT: 36.6 % (ref 36.0–46.0)
HEMOGLOBIN: 12 g/dL (ref 12.0–15.0)
Lymphocytes Relative: 16 % (ref 12–46)
Lymphs Abs: 1.3 10*3/uL (ref 0.7–4.0)
MCH: 30.8 pg (ref 26.0–34.0)
MCHC: 32.8 g/dL (ref 30.0–36.0)
MCV: 93.8 fL (ref 78.0–100.0)
MONOS PCT: 7 % (ref 3–12)
Monocytes Absolute: 0.6 10*3/uL (ref 0.1–1.0)
NEUTROS ABS: 6.1 10*3/uL (ref 1.7–7.7)
Neutrophils Relative %: 75 % (ref 43–77)
Platelets: 321 10*3/uL (ref 150–400)
RBC: 3.9 MIL/uL (ref 3.87–5.11)
RDW: 13.9 % (ref 11.5–15.5)
WBC: 8.2 10*3/uL (ref 4.0–10.5)

## 2013-09-25 MED ORDER — AMOXICILLIN 500 MG PO CAPS: 500.0000 mg | ORAL_CAPSULE | Freq: Three times a day (TID) | ORAL | Status: DC

## 2013-09-25 MED ORDER — AZITHROMYCIN 250 MG PO TABS: ORAL_TABLET | ORAL | Status: DC

## 2013-09-25 NOTE — ED Notes (Addendum)
Pt reports chronic wheezing, states she recently started having a cough and increased SHOB. Pt reports non-productive cough. Denies any fever/chills.

## 2013-09-25 NOTE — ED Notes (Signed)
MD at bedside. 

## 2013-09-25 NOTE — Discharge Instructions (Signed)
Follow up with your md this week for recheck  °

## 2013-09-25 NOTE — ED Notes (Addendum)
C/o dry cough for several days and getting worse.  Took tylenol, vit. C and cough drop with no relief.  Denies any pain.  Have tightness in shoulder but no pain.  History of lung cancer.  Seen [facility] and not due back until January for f/u CT scan.  Seen via Dr Desmond Lope July 14 th, 2015, Tumor decreased half the size.

## 2013-09-25 NOTE — ED Provider Notes (Addendum)
CSN: [id number]     Arrival date & time 09/25/13  1540 History   First MD Initiated Contact with Patient 09/25/13 1002     This chart was scribed for [name], MD by Forrestine Him, ED Scribe. This patient was seen in room APA06/APA06 and the patient's care was started 10:13 AM.  Chief Complaint  Patient presents with  . Cough  . Shortness of Breath   Patient is a 80 y.o. female presenting with cough and shortness of breath. The history is provided by the patient. No language interpreter was used.  Cough Cough characteristics:  Dry Severity:  Moderate Onset quality:  Gradual Duration:  5 days Timing:  Constant Progression:  Worsening Chronicity:  New Smoker: no   Context: sick contacts   Relieved by:  None tried Worsened by:  Nothing tried Ineffective treatments:  None tried Associated symptoms: shortness of breath   Associated symptoms: no chest pain, no eye discharge, no headaches and no rash   Shortness of breath:    Severity:  Mild   Onset quality:  Gradual   Duration:  5 days Shortness of Breath Associated symptoms: cough   Associated symptoms: no abdominal pain, no chest pain, no headaches and no rash     HPI Comments: Alicia Davidson is a 80 y.o. female with a PMHx of COPD, hypercholesteremia, osteoporosis, and lung cancer who presents to the Emergency Department complaining of a constant, moderate dry cough x 5 days that has progressively worsened. Pt also reports associated SOB. She denies any fever or chills at this time. Daughter and son in law have recently been sick with similar symptoms. Pt is a former smoker and quit December 2013. Alicia Davidson was recently diagnosed with lung cancer March 2015. However, tumor has shrunk half in size post treatment. No known allergies to medications.  Past Medical History  Diagnosis Date  . COPD (chronic obstructive pulmonary disease)   . Essential hypertension, benign   . Hypercholesteremia   . Osteoporosis   . B12 deficiency    . Anemia associated with acute blood loss   . Gastric ulcer with hemorrhage but without obstruction   . Weight loss, non-intentional   . Anxiety   . Umbilical hernia   . Gastritis     Mild  . Chronic systolic heart failure     Diagnosed [place] March 2014  . Cancer   . Cardiomyopathy     Probable nonischemic, LVEF 40-45%  . Lung cancer 02/17/13    left lower lobe  . Hx of radiation therapy 03/29/13- 04/05/13    SBRT- LLL lung 5400 cGy in 3 sessions   Past Surgical History  Procedure Laterality Date  . Lung biopsy Left 02/17/13    LLL  . Tubal ligation     Family History  Problem Relation Age of Onset  . Heart disease Father   . Colon cancer Sister   . Kidney failure Brother   . Other Mother     Bright's disease   History  Substance Use Topics  . Smoking status: Former Smoker -- 0.30 packs/day for 56 years    Types: Cigarettes    Quit date: 01/07/2012  . Smokeless tobacco: Never Used  . Alcohol Use: No   OB History   Grav Para Term Preterm Abortions TAB SAB Ect Mult Living                 Review of Systems  Constitutional: Negative for appetite change and fatigue.  HENT:  Negative for congestion, ear discharge and sinus pressure.   Eyes: Negative for discharge.  Respiratory: Positive for cough and shortness of breath.   Cardiovascular: Negative for chest pain.  Gastrointestinal: Negative for abdominal pain and diarrhea.  Genitourinary: Negative for frequency and hematuria.  Musculoskeletal: Negative for back pain.  Skin: Negative for rash.  Neurological: Negative for seizures and headaches.  Psychiatric/Behavioral: Negative for hallucinations.      Allergies  Review of patient's allergies indicates no known allergies.  Home Medications   Prior to Admission medications   Medication Sig Start Date End Date Taking? Authorizing Provider  Ascorbic Acid (VITAMIN C) 1000 MG tablet Take 1,000 mg by mouth 2 (two) times daily.    Historical Provider, MD  aspirin  81 MG tablet Take 81 mg by mouth daily.    Historical Provider, MD  atorvastatin (LIPITOR) 20 MG tablet Take 20 mg by mouth at bedtime.    Historical Provider, MD  calcium carbonate (OS-CAL) 600 MG TABS tablet Take 600 mg by mouth daily.    Historical Provider, MD  carvedilol (COREG) 6.25 MG tablet Take 1 tablet (6.25 mg total) by mouth 2 (two) times daily. 09/01/13   Satira Sark, MD  digoxin (LANOXIN) 0.125 MG tablet Take 1 tablet (0.125 mg total) by mouth daily. 10/20/12   Lendon Colonel, NP  Fe Bisgly-Succ-C-Thre-B12-FA (IRON 21/7 PO) Take 65 mg elemental calcium/kg/hr by mouth 2 (two) times daily.    Historical Provider, MD  folic acid (FOLVITE) 831 MCG tablet Take 400 mcg by mouth daily.    Historical Provider, MD  furosemide (LASIX) 20 MG tablet Take 20 mg by mouth daily.    Historical Provider, MD  furosemide (LASIX) 20 MG tablet TAKE TWO TABLETS BY MOUTH ONCE DAILY 07/28/13   Satira Sark, MD  ipratropium-albuterol (DUONEB) 0.5-2.5 (3) MG/3ML SOLN Take 3 mLs by nebulization every 6 (six) hours as needed (shortness of breath).     Historical Provider, MD  KLOR-CON M20 20 MEQ tablet TAKE ONE TABLET BY MOUTH ONCE DAILY 09/06/13   Satira Sark, MD  levalbuterol [facility] HFA) 45 MCG/ACT inhaler Inhale 2 puffs into the lungs every 6 (six) hours as needed for shortness of breath.     Historical Provider, MD  Multiple Vitamins-Minerals (CENTRUM SILVER) tablet Take 1 tablet by mouth daily.    Historical Provider, MD  omeprazole (PRILOSEC OTC) 20 MG tablet Take 40 mg by mouth daily.    Historical Provider, MD  valsartan (DIOVAN) 160 MG tablet Take 160 mg by mouth daily.    Historical Provider, MD   Triage Vitals: BP 112/82  Pulse 80  Temp(Src) 98.1 F (36.7 C) (Oral)  Resp 22  SpO2 96%   Physical Exam  Constitutional: She is oriented to person, place, and time. She appears well-developed.  HENT:  Head: Normocephalic.  Dry mucous membranes  Eyes: Conjunctivae and EOM are  normal. No scleral icterus.  Neck: Neck supple. No thyromegaly present.  Cardiovascular: Normal rate and regular rhythm.  Exam reveals no gallop and no friction rub.   No murmur heard. Pulmonary/Chest: No stridor. She has no wheezes. She has no rales. She exhibits no tenderness.  Abdominal: She exhibits no distension. There is no tenderness. There is no rebound.  Musculoskeletal: Normal range of motion. She exhibits no edema.  Severe scoliosis   Lymphadenopathy:    She has no cervical adenopathy.  Neurological: She is oriented to person, place, and time. She exhibits normal muscle tone. Coordination normal.  Skin: No rash noted. No erythema.  Psychiatric: She has a normal mood and affect. Her behavior is normal.    ED Course  Procedures (including critical care time)  DIAGNOSTIC STUDIES: Oxygen Saturation is 96% on RA, adequate by my interpretation.    COORDINATION OF CARE: 10:16 AM- Will order EKG, DG chest 2 view, CBC, and CMP. Discussed treatment plan with pt at bedside and pt agreed to plan.     Labs Review Labs Reviewed - No data to display  Imaging Review No results found.   EKG Interpretation None      MDM   Final diagnoses:  None   Bronchitis,  tx zpak   I personally performed the services described in this documentation, which was scribed in my presence. The recorded information has been reviewed and is accurate.    [name], MD 09/25/13 [place], MD 09/25/13 1146

## 2013-11-26 ENCOUNTER — Encounter: Payer: Self-pay | Admitting: Cardiology

## 2013-11-26 ENCOUNTER — Ambulatory Visit (INDEPENDENT_AMBULATORY_CARE_PROVIDER_SITE_OTHER): Payer: Medicare PPO | Admitting: Cardiology

## 2013-11-26 VITALS — BP 116/84 | HR 84 | Ht 60.0 in | Wt 140.0 lb

## 2013-11-26 DIAGNOSIS — I429 Cardiomyopathy, unspecified: Secondary | ICD-10-CM

## 2013-11-26 DIAGNOSIS — I10 Essential (primary) hypertension: Secondary | ICD-10-CM

## 2013-11-26 DIAGNOSIS — I428 Other cardiomyopathies: Secondary | ICD-10-CM

## 2013-11-26 NOTE — Assessment & Plan Note (Signed)
Blood pressure is normal today. Continue current regimen.

## 2013-11-26 NOTE — Patient Instructions (Signed)
Your physician wants you to follow-up in: 6 MONTHS You will receive a reminder letter in the mail two months in advance. If you don't receive a letter, please call our office to schedule the follow-up appointment.     Your physician recommends that you continue on your current medications as directed. Please refer to the Current Medication list given to you today.       Thank you for choosing [facility] !

## 2013-11-26 NOTE — Assessment & Plan Note (Signed)
Symptomatically stable on medical therapy. LVEF improved to the range of 55-60% by echocardiogram in August. No changes were made today. Follow-up in 6 months.

## 2013-11-26 NOTE — Progress Notes (Signed)
Reason for visit: Cardiomyopathy  Clinical Summary Ms. Stallbaumer is an 80 y.o.female last seen in August. She is here with her daughter for a routine visit. States that in general she has been doing fairly well since I last saw her. She will have follow-up with her oncologist in January for reassessment of lung cancer diagnosis. She reports chronic shortness of breath, no significant changes, no palpitations or chest pain.  Recent follow-up echocardiogram in August showed mild LVH with LVEF improved to the range of 32-35%, grade 1 diastolic dysfunction, mildly thickened aortic leaflets, moderate left atrial enlargement. I reviewed these results with the patient and her daughter today. Plan to continue medical therapy and observation.  Ischemic testing in June 2014 via Cardiolite showed global LV hypokinesis with overall normal perfusion arguing against ischemic heart disease.  No Known Allergies  Current Outpatient Prescriptions  Medication Sig Dispense Refill  . amoxicillin (AMOXIL) 500 MG capsule Take 1 capsule (500 mg total) by mouth 3 (three) times daily. 21 capsule 0  . Ascorbic Acid (VITAMIN C) 1000 MG tablet Take 1,000 mg by mouth 2 (two) times daily.    Marland Kitchen aspirin 81 MG tablet Take 81 mg by mouth daily.    Marland Kitchen atorvastatin (LIPITOR) 20 MG tablet Take 20 mg by mouth at bedtime.    Marland Kitchen azithromycin (ZITHROMAX Z-PAK) 250 MG tablet 2 po day one, then 1 daily x 4 days 5 tablet 0  . calcium carbonate (OS-CAL) 600 MG TABS tablet Take 600 mg by mouth daily.    . carvedilol (COREG) 6.25 MG tablet Take 1 tablet (6.25 mg total) by mouth 2 (two) times daily. 60 tablet 6  . digoxin (LANOXIN) 0.125 MG tablet Take 1 tablet (0.125 mg total) by mouth daily. 30 tablet 6  . Fe Bisgly-Succ-C-Thre-B12-FA (IRON 21/7 PO) Take 65 mg elemental calcium/kg/hr by mouth 2 (two) times daily.    . folic acid (FOLVITE) 573 MCG tablet Take 400 mcg by mouth daily.    . furosemide (LASIX) 20 MG tablet Take 40 mg by mouth  daily.     Marland Kitchen ipratropium-albuterol (DUONEB) 0.5-2.5 (3) MG/3ML SOLN Take 3 mLs by nebulization every 6 (six) hours as needed (shortness of breath).     Marland Kitchen levalbuterol (XOPENEX HFA) 45 MCG/ACT inhaler Inhale 2 puffs into the lungs every 6 (six) hours as needed for shortness of breath.     . Multiple Vitamins-Minerals (CENTRUM SILVER) tablet Take 1 tablet by mouth daily.    Marland Kitchen omeprazole (PRILOSEC OTC) 20 MG tablet Take 40 mg by mouth daily.    . potassium chloride SA (K-DUR,KLOR-CON) 20 MEQ tablet Take 20 mEq by mouth daily.    . valsartan (DIOVAN) 160 MG tablet Take 160 mg by mouth daily.     No current facility-administered medications for this visit.    Past Medical History  Diagnosis Date  . COPD (chronic obstructive pulmonary disease)   . Essential hypertension, benign   . Hypercholesteremia   . Osteoporosis   . B12 deficiency   . Anemia associated with acute blood loss   . Gastric ulcer with hemorrhage but without obstruction   . Weight loss, non-intentional   . Anxiety   . Umbilical hernia   . Gastritis     Mild  . Chronic systolic heart failure     Diagnosed [place] March 2014  . Cancer   . Cardiomyopathy     Probable nonischemic, LVEF 40-45%  . Lung cancer 02/17/13    left lower lobe  .  Hx of radiation therapy 03/29/13- 04/05/13    SBRT- LLL lung 5400 cGy in 3 sessions    Social History Ms. Freedman reports that she quit smoking about 22 months ago. Her smoking use included Cigarettes. She has a 16.8 pack-year smoking history. She has never used smokeless tobacco. Ms. Bezold reports that she does not drink alcohol.  Review of Systems Complete review of systems negative except as otherwise outlined in the clinical summary.  Physical Examination Filed Vitals:   11/26/13 1603  BP: 116/84  Pulse: 84   Filed Weights   11/26/13 1603  Weight: 140 lb (63.504 kg)    Elderly woman in no acute distress.  HEENT: Conjunctiva and lids normal, oropharynx clear.  Neck:  Supple, no elevated JVP, no thyromegaly.  Lungs: Diminished but clear to auscultation, nonlabored breathing at rest.  Cardiac: Distant regular rate and rhythm, no S3, 2/6 apical systolic murmur, no pericardial rub.  Abdomen: Soft, nontender, bowel sounds present, no guarding or rebound.  Extremities: No pitting edema, distal pulses 1-2+.    Problem List and Plan   Nonischemic cardiomyopathy Symptomatically stable on medical therapy. LVEF improved to the range of 55-60% by echocardiogram in August. No changes were made today. Follow-up in 6 months.  Essential hypertension, benign Blood pressure is normal today. Continue current regimen.    Satira Sark, M.D., F.A.C.C.

## 2014-01-04 ENCOUNTER — Other Ambulatory Visit: Payer: Self-pay | Admitting: Cardiology

## 2014-01-04 MED ORDER — POTASSIUM CHLORIDE CRYS ER 20 MEQ PO TBCR: 20.0000 meq | EXTENDED_RELEASE_TABLET | Freq: Every day | ORAL | Status: DC

## 2014-01-04 NOTE — Telephone Encounter (Signed)
Please see refill bin / tgs  °

## 2014-01-11 ENCOUNTER — Encounter: Payer: Self-pay | Admitting: Radiation Oncology

## 2014-01-11 ENCOUNTER — Ambulatory Visit
Admission: RE | Admit: 2014-01-11 | Discharge: 2014-01-11 | Disposition: A | Discharge status: Home / Self Care | Payer: Medicare PPO | Source: Ambulatory Visit | Attending: Radiation Oncology | Admitting: Radiation Oncology

## 2014-01-11 VITALS — BP 141/68 | HR 70 | Temp 97.5°F | Resp 22 | Wt 138.0 lb

## 2014-01-11 DIAGNOSIS — C3432 Malignant neoplasm of lower lobe, left bronchus or lung: Secondary | ICD-10-CM

## 2014-01-11 NOTE — Progress Notes (Signed)
Patient denies pain, loss of appetite, fatigue beyond her baseline. She has occasional dry cough and her baseline SOB with minimal activity. She does not use O2. Her last CXR was 09/21/13.

## 2014-01-11 NOTE — Progress Notes (Signed)
CC: Dr. Modesto Charon, Alicia Davidson  Follow-up note:  Alicia Davidson visits today approximately 9 months following completion of SBRT in the management of her squamous cell carcinoma of the lower lobe of the left lung.  She is without complaints today.  Her baseline dyspnea is unchanged.  She is not on O2.  She had a chest [facility] at [facility] when she presented with cough and shortness of breath.  The previously seen left lower lobe mass was not clearly visible.  She tells me she will see Alicia Davidson again this February or March.  Physical examination: Alert and oriented. Filed Vitals:   01/11/14 1446  BP: 141/68  Pulse: 70  Temp: 97.5 F (36.4 C)  Resp: 22   Head and neck examination: Grossly unremarkable.  Nodes: Without palpable cervical or supraclavicular lymphadenopathy.  Chest: Breath sounds distant, otherwise clear.  Impression: Satisfactory progress with favorable response.  Plan: Follow-up visit with Dr. Luan Pulling in February or March.  Dr. Luan Pulling can obtain a follow-up chest x-ray at that time.  If this is not done then I will repeat her chest x-ray when she returns to see me for a follow-up visit in 6 months.

## 2014-02-04 ENCOUNTER — Emergency Department (HOSPITAL_COMMUNITY)
Admission: EM | Admit: 2014-02-04 | Discharge: 2014-02-04 | Disposition: A | Discharge status: Home / Self Care | Payer: Medicare PPO | Attending: Emergency Medicine | Admitting: Emergency Medicine

## 2014-02-04 ENCOUNTER — Emergency Department (HOSPITAL_COMMUNITY): Payer: Medicare PPO

## 2014-02-04 ENCOUNTER — Encounter (HOSPITAL_COMMUNITY): Payer: Self-pay | Admitting: *Deleted

## 2014-02-04 DIAGNOSIS — R109 Unspecified abdominal pain: Secondary | ICD-10-CM

## 2014-02-04 DIAGNOSIS — I5022 Chronic systolic (congestive) heart failure: Secondary | ICD-10-CM | POA: Diagnosis not present

## 2014-02-04 DIAGNOSIS — K254 Chronic or unspecified gastric ulcer with hemorrhage: Secondary | ICD-10-CM | POA: Insufficient documentation

## 2014-02-04 DIAGNOSIS — I10 Essential (primary) hypertension: Secondary | ICD-10-CM | POA: Insufficient documentation

## 2014-02-04 DIAGNOSIS — E780 Pure hypercholesterolemia: Secondary | ICD-10-CM | POA: Insufficient documentation

## 2014-02-04 DIAGNOSIS — Z8659 Personal history of other mental and behavioral disorders: Secondary | ICD-10-CM | POA: Insufficient documentation

## 2014-02-04 DIAGNOSIS — R1010 Upper abdominal pain, unspecified: Secondary | ICD-10-CM | POA: Insufficient documentation

## 2014-02-04 DIAGNOSIS — Z79899 Other long term (current) drug therapy: Secondary | ICD-10-CM | POA: Diagnosis not present

## 2014-02-04 DIAGNOSIS — D62 Acute posthemorrhagic anemia: Secondary | ICD-10-CM | POA: Insufficient documentation

## 2014-02-04 DIAGNOSIS — Z85118 Personal history of other malignant neoplasm of bronchus and lung: Secondary | ICD-10-CM | POA: Diagnosis not present

## 2014-02-04 DIAGNOSIS — J449 Chronic obstructive pulmonary disease, unspecified: Secondary | ICD-10-CM | POA: Insufficient documentation

## 2014-02-04 DIAGNOSIS — Z87891 Personal history of nicotine dependence: Secondary | ICD-10-CM | POA: Insufficient documentation

## 2014-02-04 DIAGNOSIS — Z7982 Long term (current) use of aspirin: Secondary | ICD-10-CM | POA: Diagnosis not present

## 2014-02-04 HISTORY — DX: Dyspnea, unspecified: R06.00

## 2014-02-04 LAB — CBC WITH DIFFERENTIAL/PLATELET
BASOS ABS: 0 10*3/uL (ref 0.0–0.1)
BASOS PCT: 0 % (ref 0–1)
EOS ABS: 0.2 10*3/uL (ref 0.0–0.7)
Eosinophils Relative: 2 % (ref 0–5)
HCT: 39.4 % (ref 36.0–46.0)
HEMOGLOBIN: 12.4 g/dL (ref 12.0–15.0)
Lymphocytes Relative: 23 % (ref 12–46)
Lymphs Abs: 2 10*3/uL (ref 0.7–4.0)
MCH: 30.4 pg (ref 26.0–34.0)
MCHC: 31.5 g/dL (ref 30.0–36.0)
MCV: 96.6 fL (ref 78.0–100.0)
Monocytes Absolute: 0.6 10*3/uL (ref 0.1–1.0)
Monocytes Relative: 6 % (ref 3–12)
NEUTROS PCT: 69 % (ref 43–77)
Neutro Abs: 6.1 10*3/uL (ref 1.7–7.7)
Platelets: 338 10*3/uL (ref 150–400)
RBC: 4.08 MIL/uL (ref 3.87–5.11)
RDW: 14.5 % (ref 11.5–15.5)
WBC: 8.9 10*3/uL (ref 4.0–10.5)

## 2014-02-04 LAB — URINALYSIS, ROUTINE W REFLEX MICROSCOPIC
Bilirubin Urine: NEGATIVE
GLUCOSE, UA: NEGATIVE mg/dL
Hgb urine dipstick: NEGATIVE
Ketones, ur: NEGATIVE mg/dL
Leukocytes, UA: NEGATIVE
NITRITE: NEGATIVE
Protein, ur: NEGATIVE mg/dL
SPECIFIC GRAVITY, URINE: 1.025 (ref 1.005–1.030)
UROBILINOGEN UA: 0.2 mg/dL (ref 0.0–1.0)
pH: 5.5 (ref 5.0–8.0)

## 2014-02-04 LAB — COMPREHENSIVE METABOLIC PANEL
ALK PHOS: 67 U/L (ref 39–117)
ALT: 26 U/L (ref 0–35)
ANION GAP: 8 (ref 5–15)
AST: 21 U/L (ref 0–37)
Albumin: 3.9 g/dL (ref 3.5–5.2)
BUN: 31 mg/dL — ABNORMAL HIGH (ref 6–23)
CO2: 27 mmol/L (ref 19–32)
CREATININE: 1.11 mg/dL — AB (ref 0.50–1.10)
Calcium: 9 mg/dL (ref 8.4–10.5)
Chloride: 102 mmol/L (ref 96–112)
GFR calc Af Amer: 53 mL/min — ABNORMAL LOW (ref >90)
GFR calc non Af Amer: 45 mL/min — ABNORMAL LOW (ref >90)
GLUCOSE: 115 mg/dL — AB (ref 70–99)
POTASSIUM: 3.6 mmol/L (ref 3.5–5.1)
Sodium: 137 mmol/L (ref 135–145)
Total Bilirubin: 0.5 mg/dL (ref 0.3–1.2)
Total Protein: 7.1 g/dL (ref 6.0–8.3)

## 2014-02-04 LAB — TROPONIN I: Troponin I: <0.03 ng/mL (ref <0.031)

## 2014-02-04 LAB — DIGOXIN LEVEL: Digoxin Level: 0.6 ng/mL — ABNORMAL LOW (ref 0.8–2.0)

## 2014-02-04 LAB — LIPASE, BLOOD: Lipase: 54 U/L (ref 11–59)

## 2014-02-04 MED ORDER — GI COCKTAIL ~~LOC~~
30.0000 mL | Freq: Once | ORAL | Status: AC
  Administered 2014-02-04: 30 mL via ORAL
  Filled 2014-02-04: qty 30

## 2014-02-04 NOTE — Discharge Instructions (Signed)
°Emergency Department Resource Guide °1) Find a Doctor and Pay Out of Pocket °Although you won't have to find out who is covered by your insurance plan, it is a good idea to ask around and get recommendations. You will then need to call the office and see if the doctor you have chosen will accept you as a new patient and what types of options they offer for patients who are self-pay. Some doctors offer discounts or will set up payment plans for their patients who do not have insurance, but you will need to ask so you aren't surprised when you get to your appointment. ° °2) Contact Your Local Health Department °Not all health departments have doctors that can see patients for sick visits, but many do, so it is worth a call to see if yours does. If you don't know where your local health department is, you can check in your phone book. The CDC also has a tool to help you locate your state's health department, and many state websites also have listings of all of their local health departments. ° °3) Find a Walk-in Clinic °If your illness is not likely to be very severe or complicated, you may want to try a walk in clinic. These are popping up all over the country in pharmacies, drugstores, and shopping centers. They're usually staffed by nurse practitioners or physician assistants that have been trained to treat common illnesses and complaints. They're usually fairly quick and inexpensive. However, if you have serious medical issues or chronic medical problems, these are probably not your best option. ° °No Primary Care Doctor: °- Call [facility] at  832-8000 - they can help you locate a primary care doctor that  accepts your insurance, provides certain services, etc. °- Physician Referral Service- 1-800-533-3463 ° °Chronic Pain Problems: °Organization         Address  Phone   Notes  °[facility]  (336) [phone] Patients need to be referred by their primary care doctor.  ° °Medication  Assistance: °Organization         Address  Phone   Notes  °[place] [facility] [address] °[place], [place] [address] (336) 641-8030 --Must be a resident of [place] °-- Must have NO insurance coverage whatsoever (no Medicaid/ Medicare, etc.) °-- The pt. MUST have a primary care doctor that directs their care regularly and follows them in the community °  °[facility]  (866) 331-1348   °[employer]  (888) 892-1162   ° °Agencies that provide inexpensive medical care: °Organization         Address  Phone   Notes  °[facility]  (336) 832-8035   °[facility]    (336) 832-7272   °[facility] [address] °[place], [place] [address] (336) 832-4777   °[facility] [address], °[place] (336) 271-4999   °[facility]    (336) 373-0678   °[facility]    (336) 272-1050   °[facility] ° [address], [place] Phone:  (336) 832-4444, Fax:  (336) 832-4440 Hours of Operation:  9 am - 6 pm, M-F.  Also accepts Medicaid/Medicare and self-pay.  °[facility] ° [address],  Phone: (336) 832-3150, Fax: (336) [phone]. Hours of Operation:  8:30 am - 5:30 pm, M-F.  Also accepts Medicaid and self-pay.  °[facility] [place] [address], [place] Phone: (336) 878-6027   °[facility] [address], [place], [place] (336)723-1848, Ext. 123 Mondays & Thursdays: 7-9 AM.  First 15 patients are seen on a first come, first serve basis. °  ° °Medicaid-accepting [place] Providers: ° °Organization         Address  Phone   Notes  °[facility] [address], [place] (336) 641-2100 Also accepts self-pay patients.  °[facility] [address], [place] ° (336) 856-9996   °[facility] [address], [place]  (336) 288-8857   °[facility] [address] [place] [address], [place] (336) 299-7000   °Veita Bland [address], [place]  ° (336) 373-1557 Only accepts [place] Access Medicaid patients after they have their name applied to their card.  ° °Self-Pay (no insurance) in [place]: ° °Organization         Address  Phone   Notes  °Sickle Cell Patients, [facility] [address], [place] (336) 832-1970   °[facility] [address], [place] (336) 832-4400   °[facility] ° [address], [place] (336) 992-4800   °[facility]/Dr. Osei-Bonsu ° [address] [place] [address], [place] or [address], [place] (336) [phone] Phone number for both [place] and [place] locations is the same.  °[facility] [address], [place] (336) 299-0000   °[facility] [address] [place] [address], [place] or [address] (336) 852-7530 °(336) 878-2260   °[facility] [address], [place] (336) 350-1642, phone; (336) 294-5005, fax Sees patients 1st and 3rd Saturday of every month.  Must not qualify for public or private insurance (i.e. Medicaid, Medicare, [place] Health Choice, Veterans' Benefits) • Household income should be no more than 200% of the poverty level •The clinic cannot treat you if you are pregnant or think you are pregnant • Sexually transmitted diseases are not treated at the clinic.  ° ° °Dental Care: °Organization         Address  Phone  Notes  °[place] [facility] [address],  (336) 641-6152 Accepts children up to age 21 who are enrolled in Medicaid or [place] Health Choice; pregnant women with a Medicaid card; and children who have applied for Medicaid or [place] Health Choice, but were declined, whose parents can pay a reduced fee at time of service.  °[place]  [facility] [place]  [address], [place] (336) 641-7733 Accepts children up to age 21 who are enrolled in Medicaid or [place] Health Choice; pregnant women with a Medicaid card; and children who have applied for Medicaid or [place] Health Choice, but were declined, whose parents can pay a reduced fee at time of service.  °[facility] ° [address],  (336) 641-4533 Patients are seen by appointment only. Walk-ins are not accepted. [facility] will see patients 18 years of age and older. °Monday - Tuesday (8am-5pm) °Most Wednesdays (8:30-5pm) °$30 per visit, cash only  °[facility] ° [address], [place] (336) 641-4533 Patients are seen by appointment only. Walk-ins are not accepted. [facility] will see patients 18 years of age and older. °One   Wednesday Evening (Monthly: Volunteer Based).  $30 per visit, cash only  °[facility]  (919) 537-3737 for adults; Children under age 4, call Graduate Pediatric Dentistry at ([phone]) [phone]. Children aged 4-14, please call ([phone]) [phone] to request a pediatric application. ° Dental services are provided in all areas of dental care including fillings, crowns and bridges, complete and partial dentures, implants, gum treatment, root canals, and extractions. Preventive care is also provided. Treatment is provided to both adults and children. °Patients are selected via a lottery and there is often a waiting list. °  °[facility] [address], °[place] ° (336) 763-8833 www.drcivils.[email or web address] °  °[facility] [address], [place], [place] (336)723-1848, Ext. 123 Second and Fourth Thursday of each month, opens at 6:30 AM; Clinic ends at 9 AM.  Patients are seen on a first-come first-served basis, and a limited number are seen during each clinic.  ° °[facility] ° [address], [place], [place] (336) [phone]    Eligibility Requirements °You must have lived in [place], [place], or [place] counties for at least the last three months. °  You cannot be eligible for state or federal sponsored healthcare insurance, including Veterans Administration, Medicaid, or Medicare. °  You generally cannot be eligible for healthcare insurance through your employer.  °  How to apply: °Eligibility screenings are held every Tuesday and Wednesday afternoon from 1:00 pm until 4:00 pm. You do not need an appointment for the interview!  °[facility] [address], [place], [place] 336-631-2330   °[facility]  336-342-8273   °[facility]  336-703-3100   °[facility]  336-570-6415   ° °Behavioral Health Resources in the Community: °Intensive Outpatient Programs °Organization         Address  Phone  Notes  °[facility] [address], [place], [place] 336-878-6098   °[facility] [address], [place], [place] 336-832-9800   °[facility] [address], [place], [place] ° 336-882-2125   °[facility] [address],  °[place], [place] 1-800-853-5163 or 336-641-4981   °Substance Abuse Resources °Organization         Address  Phone  Notes  °[facility]  336-882-2125   °[facility]  336-784-9470   °[facility]  336-285-9073   °[facility]  336-845-3988   °Residential & Outpatient Substance Abuse Program  1-800-659-3381   °Psychological Services °Organization         Address  Phone  Notes  °[facility]  336- 832-9600   °[facility]  336- 378-7881   °[facility] [address], [place] 1-800-853-5163 or 336-641-4981   ° °Mobile Crisis Teams °Organization         Address  Phone  Notes  °[facility], Mobile Crisis Care Unit  1-877-626-1772   °Assertive °[facility] ° [address]  [place], [place] 336-834-9664   °Sharon DeEsch [address] °[place] 336-554-5454   ° °Self-Help/Support Groups °Organization         Address  Phone             Notes  °[facility] [place] - variety of support groups  336- 373-1402 Call for more information  °Narcotics Anonymous (NA), [facility] [address], °[place]  2 meetings at this location  ° °  Residential Treatment Programs Organization         Address  Phone  Notes  [facility] [address],    [place]  1-754-314-2917   [facility]  [address] [place] [address], [place], [place]   [place], [place] [phone] Admissions: 8am-3pm M-F  [facility] [place] [address]    [place], [place] [phone]   [facility] [address] [place], [place], [place]   [facility] [address]  [place], [place]   [facility] Intensive Outpatient [address], [place], [place]   [facility]  [place], [place] 1-(914)792-0349 or 559-585-4833   [facility] ([facility] [address] [place], [place] Accepts Medicaid  [facility] [address]  [place] 1-437-864-9889 Substance Abuse/Addiction Treatment   [facility]         Address  Phone  Notes  [facility]  470-421-0886   Domenic Schwab, PhD [address] [place], [place]   580-356-7539 or (778) 809-1728   [facility] [place], [place] (346)[phone]   [facility] [address] [place], [place] [phone] Insurance/Medicaid/sponsorship through [facility] [address]                                    [place], [place] [phone] [facility] [address], [place] [phone]    Dr. Adele Schilder  [phone]   [facility] 1) [address]  2) [place] 3)  [address], [place] [phone]  [phone]   [place] [phone] or [phone] (After Hours)      Eat a bland diet, avoiding greasy, fatty, fried foods, as well as spicy and acidic foods or beverages.  Avoid eating within the hour or 2 before going to bed or laying down.  Also avoid teas, colas, coffee, chocolate, pepermint and spearment.  Take over the counter pepcid, one tablet by mouth twice a day, for the next 2 to 3 weeks.  May also take over the counter maalox/mylanta, as directed on packaging, as needed for discomfort.  Take your usual prescriptions as previously directed.  Call your regular medical doctor on Monday to schedule a follow up appointment in the next 3 days.  Return to the Emergency Department immediately if worsening.

## 2014-02-04 NOTE — ED Notes (Signed)
Daughter Budd Palmer [phone].

## 2014-02-04 NOTE — ED Notes (Signed)
Upper abd pain, worse on left side x 1 wk.  Seen PCP earlier this week, reports is not better.  Denies fever/n/v/d.

## 2014-02-04 NOTE — ED Provider Notes (Signed)
CSN: [id number]     Arrival date & time 02/04/14  1710 History   First MD Initiated Contact with Patient 02/04/14 1933     Chief Complaint  Patient presents with  . Abdominal Pain      HPI  Pt was seen at 2010.  Per pt and her family, c/o gradual onset and persistence of constant upper abd "pain" for the past 2 weeks. Describes the abd pain as "aching." Pt states the pain began after she "was leaning over the seatbelt in the car" during a car ride 2 weeks ago. Pt states she "thought I just bruised myself." Pt states she has been evaluated by her PMD x2 for same with same dx. Pt states she was "starting to feel a little better," until a few days ago when her pain "started to get worse again." Denies N/V, no diarrhea, no fevers, no back pain, no rash, no CP/SOB, no black or blood in stools.       Past Medical History  Diagnosis Date  . COPD (chronic obstructive pulmonary disease)   . Essential hypertension, benign   . Hypercholesteremia   . Osteoporosis   . B12 deficiency   . Anemia associated with acute blood loss   . Gastric ulcer with hemorrhage but without obstruction   . Weight loss, non-intentional   . Anxiety   . Umbilical hernia   . Gastritis     Mild  . Chronic systolic heart failure     Diagnosed [place] March 2014  . Cancer   . Cardiomyopathy     Probable nonischemic, LVEF 40-45%  . Lung cancer 02/17/13    left lower lobe  . Hx of radiation therapy 03/29/13- 04/05/13    SBRT- LLL lung 5400 cGy in 3 sessions  . Dyspnea     chronic   Past Surgical History  Procedure Laterality Date  . Lung biopsy Left 02/17/13    LLL  . Tubal ligation     Family History  Problem Relation Age of Onset  . Heart disease Father   . Colon cancer Sister   . Kidney failure Brother   . Other Mother     Bright's disease   History  Substance Use Topics  . Smoking status: Former Smoker -- 0.30 packs/day for 56 years    Types: Cigarettes    Quit date: 01/07/2012  . Smokeless tobacco:  Never Used  . Alcohol Use: No    Review of Systems ROS: Statement: All systems negative except as marked or noted in the HPI; Constitutional: Negative for fever and chills. ; ; Eyes: Negative for eye pain, redness and discharge. ; ; ENMT: Negative for ear pain, hoarseness, nasal congestion, sinus pressure and sore throat. ; ; Cardiovascular: Negative for chest pain, palpitations, diaphoresis, dyspnea and peripheral edema. ; ; Respiratory: Negative for cough, wheezing and stridor. ; ; Gastrointestinal: +abd pain. Negative for nausea, vomiting, diarrhea, blood in stool, hematemesis, jaundice and rectal bleeding. . ; ; Genitourinary: Negative for dysuria, flank pain and hematuria. ; ; Musculoskeletal: Negative for back pain and neck pain. Negative for swelling and trauma.; ; Skin: Negative for pruritus, rash, abrasions, blisters, bruising and skin lesion.; ; Neuro: Negative for headache, lightheadedness and neck stiffness. Negative for weakness, altered level of consciousness , altered mental status, extremity weakness, paresthesias, involuntary movement, seizure and syncope.      Allergies  Review of patient's allergies indicates no known allergies.  Home Medications   Prior to Admission medications   Medication  Sig Start Date End Date Taking? Authorizing Provider  Ascorbic Acid (VITAMIN C) 1000 MG tablet Take 1,000 mg by mouth 2 (two) times daily.    Historical Provider, MD  aspirin 81 MG tablet Take 81 mg by mouth daily.    Historical Provider, MD  atorvastatin (LIPITOR) 20 MG tablet Take 20 mg by mouth at bedtime.    Historical Provider, MD  calcium carbonate (OS-CAL) 600 MG TABS tablet Take 600 mg by mouth daily.    Historical Provider, MD  carvedilol (COREG) 6.25 MG tablet Take 1 tablet (6.25 mg total) by mouth 2 (two) times daily. 09/01/13   Satira Sark, MD  digoxin (LANOXIN) 0.125 MG tablet Take 1 tablet (0.125 mg total) by mouth daily. 10/20/12   Lendon Colonel, NP  Fe  Bisgly-Succ-C-Thre-B12-FA (IRON 21/7 PO) Take 65 mg elemental calcium/kg/hr by mouth 2 (two) times daily.    Historical Provider, MD  folic acid (FOLVITE) 062 MCG tablet Take 400 mcg by mouth daily.    Historical Provider, MD  furosemide (LASIX) 20 MG tablet Take 40 mg by mouth daily.     Historical Provider, MD  ipratropium-albuterol (DUONEB) 0.5-2.5 (3) MG/3ML SOLN Take 3 mLs by nebulization every 6 (six) hours as needed (shortness of breath).     Historical Provider, MD  levalbuterol [facility] HFA) 45 MCG/ACT inhaler Inhale 2 puffs into the lungs every 6 (six) hours as needed for shortness of breath.     Historical Provider, MD  methylPREDNIsolone (MEDROL DOSPACK) 4 MG tablet  11/18/13   Historical Provider, MD  Multiple Vitamins-Minerals (CENTRUM SILVER) tablet Take 1 tablet by mouth daily.    Historical Provider, MD  omeprazole (PRILOSEC OTC) 20 MG tablet Take 40 mg by mouth daily.    Historical Provider, MD  potassium chloride SA (K-DUR,KLOR-CON) 20 MEQ tablet Take 1 tablet (20 mEq total) by mouth daily. 01/04/14   Satira Sark, MD  valsartan (DIOVAN) 160 MG tablet Take 160 mg by mouth daily.    Historical Provider, MD   BP 111/84 mmHg  Pulse 86  Temp(Src) 98 F (36.7 C) (Oral)  Resp 16  Ht 4' 10.5" (1.486 m)  Wt 135 lb (61.236 kg)  BMI 27.73 kg/m2  SpO2 95% Physical Exam  2015: Physical examination:  Nursing notes reviewed; Vital signs and O2 SAT reviewed;  Constitutional: Well developed, Well nourished, Well hydrated, In no acute distress; Head:  Normocephalic, atraumatic; Eyes: EOMI, PERRL, No scleral icterus; ENMT: Mouth and pharynx normal, Mucous membranes moist; Neck: Supple, Full range of motion, No lymphadenopathy; Cardiovascular: Regular rate and rhythm, No gallop; Respiratory: Breath sounds clear & equal bilaterally, No wheezes.  Speaking full sentences with ease, Normal respiratory effort/excursion; Chest: Nontender, Movement normal; Abdomen: Soft, +mild mid-epigastric and  LUQ tenderness to palp. No rebound or guarding. No rash, no erythema, no ecchymosis. +NT easily reducible umbilical hernia. Nondistended, Normal bowel sounds; Genitourinary: No CVA tenderness; Extremities: Pulses normal, No tenderness, No edema, No calf edema or asymmetry.; Neuro: AA&Ox3, Major CN grossly intact.  Speech clear. No gross focal motor or sensory deficits in extremities.; Skin: Color normal, Warm, Dry.; Psych:  Anxious.    ED Course  Procedures     EKG Interpretation   Date/Time:  Friday February 04 2014 19:58:17 EST Ventricular Rate:  60 PR Interval:  278 QRS Duration: 115 QT Interval:  561 QTC Calculation: 561 R Axis:   44 Text Interpretation:  Sinus rhythm with 1st degree A-V block Nonspecific  intraventricular conduction delay Nonspecific  repol abnormality, diffuse  leads Prolonged QT Baseline wander When compared with ECG of 09/25/2013 No  significant change was found Confirmed by [facility]  MD, Nunzio Cory [phone])  on 02/04/2014 8:28:28 PM      MDM  MDM Reviewed: previous chart, nursing note and vitals Reviewed previous: labs and ECG Interpretation: labs, ECG and x-ray   Results for orders placed or performed during the hospital encounter of 02/04/14  CBC with Differential  Result Value Ref Range   WBC 8.9 4.0 - 10.5 K/uL   RBC 4.08 3.87 - 5.11 MIL/uL   Hemoglobin 12.4 12.0 - 15.0 g/dL   HCT 39.4 36.0 - 46.0 %   MCV 96.6 78.0 - 100.0 fL   MCH 30.4 26.0 - 34.0 pg   MCHC 31.5 30.0 - 36.0 g/dL   RDW 14.5 11.5 - 15.5 %   Platelets 338 150 - 400 K/uL   Neutrophils Relative % 69 43 - 77 %   Neutro Abs 6.1 1.7 - 7.7 K/uL   Lymphocytes Relative 23 12 - 46 %   Lymphs Abs 2.0 0.7 - 4.0 K/uL   Monocytes Relative 6 3 - 12 %   Monocytes Absolute 0.6 0.1 - 1.0 K/uL   Eosinophils Relative 2 0 - 5 %   Eosinophils Absolute 0.2 0.0 - 0.7 K/uL   Basophils Relative 0 0 - 1 %   Basophils Absolute 0.0 0.0 - 0.1 K/uL  Lipase, blood  Result Value Ref Range   Lipase 54 11  - 59 U/L  Comprehensive metabolic panel  Result Value Ref Range   Sodium 137 135 - 145 mmol/L   Potassium 3.6 3.5 - 5.1 mmol/L   Chloride 102 96 - 112 mmol/L   CO2 27 19 - 32 mmol/L   Glucose, Bld 115 (H) 70 - 99 mg/dL   BUN 31 (H) 6 - 23 mg/dL   Creatinine, Ser 1.11 (H) 0.50 - 1.10 mg/dL   Calcium 9.0 8.4 - 10.5 mg/dL   Total Protein 7.1 6.0 - 8.3 g/dL   Albumin 3.9 3.5 - 5.2 g/dL   AST 21 0 - 37 U/L   ALT 26 0 - 35 U/L   Alkaline Phosphatase 67 39 - 117 U/L   Total Bilirubin 0.5 0.3 - 1.2 mg/dL   GFR calc non Af Amer 45 (L) >90 mL/min   GFR calc Af Amer 53 (L) >90 mL/min   Anion gap 8 5 - 15  Troponin I  Result Value Ref Range   Troponin I <0.03 <0.031 ng/mL  Digoxin level  Result Value Ref Range   Digoxin Level 0.6 (L) 0.8 - 2.0 ng/mL   Dg Abd Acute W/chest 02/04/2014   CLINICAL DATA:  Upper abdominal pain, worse on the left side for 1 week. History of lung cancer on the left with biopsy 02/17/2013. History of hypertension, COPD, chronic systolic heart failure, cardiomyopathy, tubal ligation.  EXAM: ACUTE ABDOMEN SERIES (ABDOMEN 2 VIEW & CHEST 1 VIEW)  COMPARISON:  Chest 09/25/2013  FINDINGS: Borderline heart size with normal pulmonary vascularity. Emphysematous changes in the lungs. Central interstitial pattern to the lungs suggesting chronic bronchitis. No focal consolidation or airspace disease. No blunting of costophrenic angles. No pneumothorax. Calcification of the aorta.  Gas and stool throughout the colon. No small or large bowel distention. No free intra-abdominal air. No abnormal air-fluid levels. Vascular calcifications. Diffuse bone demineralization. Degenerative changes in the lumbar spine and hips. No radiopaque stones.  IMPRESSION: Cardiac enlargement. Emphysematous changes and chronic bronchitic changes  in the lungs. Nonobstructive bowel gas pattern with stool-filled colon.   Electronically Signed   By: Lucienne Capers M.D.   On: 02/04/2014 21:21    2200:  Pt has tol  PO well while in the ED without N/V.  No stooling while in the ED.  VS remain stable. Pt feels better after meds wants to go home now. Doubt ACS as cause for symptoms with normal troponin and unchanged EKG from previous after 2 weeks of constant atypical symptoms (abd pain).  Tx symptomatically at this time. Dx and testing d/w pt and family.  Questions answered.  Verb understanding, agreeable to d/c home with outpt f/u.       Francine Graven, DO 02/07/14 1306

## 2014-03-03 ENCOUNTER — Other Ambulatory Visit: Payer: Self-pay | Admitting: Adult Health

## 2014-03-03 MED ORDER — FUROSEMIDE 20 MG PO TABS: 40.0000 mg | ORAL_TABLET | Freq: Every day | ORAL | Status: DC

## 2014-03-03 NOTE — Telephone Encounter (Signed)
Needs refill on Furosemide sent to [facility] / [facility]

## 2014-03-03 NOTE — Telephone Encounter (Signed)
escribed refill

## 2014-03-08 ENCOUNTER — Other Ambulatory Visit (HOSPITAL_COMMUNITY): Payer: Self-pay | Admitting: Pulmonary Disease

## 2014-03-08 ENCOUNTER — Other Ambulatory Visit: Payer: Self-pay

## 2014-03-08 DIAGNOSIS — R109 Unspecified abdominal pain: Secondary | ICD-10-CM

## 2014-03-08 MED ORDER — FUROSEMIDE 20 MG PO TABS: 40.0000 mg | ORAL_TABLET | Freq: Every day | ORAL | Status: DC

## 2014-03-10 ENCOUNTER — Ambulatory Visit (HOSPITAL_COMMUNITY)
Admission: RE | Admit: 2014-03-10 | Discharge: 2014-03-10 | Disposition: A | Discharge status: Home / Self Care | Payer: Medicare PPO | Source: Ambulatory Visit | Attending: Pulmonary Disease | Admitting: Pulmonary Disease

## 2014-03-10 DIAGNOSIS — Z85118 Personal history of other malignant neoplasm of bronchus and lung: Secondary | ICD-10-CM | POA: Insufficient documentation

## 2014-03-10 DIAGNOSIS — R1012 Left upper quadrant pain: Secondary | ICD-10-CM | POA: Insufficient documentation

## 2014-03-10 DIAGNOSIS — R109 Unspecified abdominal pain: Secondary | ICD-10-CM

## 2014-03-10 LAB — POCT I-STAT CREATININE: Creatinine, Ser: 1.2 mg/dL — ABNORMAL HIGH (ref 0.50–1.10)

## 2014-03-10 MED ORDER — IOHEXOL 300 MG/ML  SOLN
80.0000 mL | Freq: Once | INTRAMUSCULAR | Status: AC | PRN
  Administered 2014-03-10: 80 mL via INTRAVENOUS

## 2014-03-17 ENCOUNTER — Telehealth: Payer: Self-pay | Admitting: *Deleted

## 2014-03-17 DIAGNOSIS — I10 Essential (primary) hypertension: Secondary | ICD-10-CM

## 2014-03-17 DIAGNOSIS — J390 Retropharyngeal and parapharyngeal abscess: Secondary | ICD-10-CM

## 2014-03-17 MED ORDER — CARVEDILOL 6.25 MG PO TABS: 6.2500 mg | ORAL_TABLET | Freq: Two times a day (BID) | ORAL | Status: DC

## 2014-03-17 NOTE — Telephone Encounter (Signed)
Fax from [facility] for carvedilol 6.25 mg tab

## 2014-03-30 ENCOUNTER — Other Ambulatory Visit: Payer: Self-pay | Admitting: Adult Health

## 2014-06-14 ENCOUNTER — Encounter: Payer: Self-pay | Admitting: Radiation Oncology

## 2014-06-14 ENCOUNTER — Ambulatory Visit (HOSPITAL_COMMUNITY)
Admission: RE | Admit: 2014-06-14 | Discharge: 2014-06-14 | Disposition: A | Discharge status: Home / Self Care | Payer: Medicare PPO | Source: Ambulatory Visit | Attending: Radiation Oncology | Admitting: Radiation Oncology

## 2014-06-14 ENCOUNTER — Other Ambulatory Visit: Payer: Self-pay | Admitting: Radiation Oncology

## 2014-06-14 ENCOUNTER — Ambulatory Visit: Payer: Medicare PPO | Admitting: Radiation Oncology

## 2014-06-14 VITALS — BP 167/58 | HR 66 | Temp 98.5°F | Ht 58.5 in | Wt 129.9 lb

## 2014-06-14 DIAGNOSIS — C3432 Malignant neoplasm of lower lobe, left bronchus or lung: Secondary | ICD-10-CM

## 2014-06-14 NOTE — Progress Notes (Signed)
CC: Dr. Modesto Charon, Dr. 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  She is generally doing well.  She has not had a recent chest x-ray.  Her baseline dyspnea is unchanged.  She states that her appetite is good, however weight is down 5 pounds over the past 6 months.  No change in her chronic cough.  Physical examination: Alert and oriented. Filed Vitals:   06/14/14 1533  BP: 167/58  Pulse:   Temp:     Head and neck examination: Grossly unremarkable.  Nodes: Without palpable cervical, or supraclavicular lymphadenopathy.  Chest: Lungs clear.  Chest x-ray today shows a try to density at the side of her radiosurgery.  This is new compared to her six-month follow-up/posttreatment chest x-ray from September 2015.  Impression: The chest x-ray findings could represent fibrosis/scarring from her radiosurgery.  However, we generally see some degree of scarring within 3-6 months following radiosurgery.  I think we need to proceed with a PET scan to rule out recurrent disease.  We will get this scheduled within the next one to 2 weeks.  I reviewed her chest x-ray and discussed her findings with her family.  Plan: I will give the patient a call after her PET scan is scheduled her follow-up as appropriate.

## 2014-06-14 NOTE — Progress Notes (Signed)
Ms, Cianci here today for reassessment S/P SBRT of her left lung.  Denies SOB while sitting, but evident during activity.  Denies any pain.

## 2014-06-15 ENCOUNTER — Encounter: Payer: Self-pay | Admitting: Radiation Oncology

## 2014-06-15 NOTE — Progress Notes (Signed)
Alicia Davidson is scheduled to have her PET scan on 06/24/14 @ 9am; NPO for 6 hours. Alicia Davidson was agreeable.

## 2014-06-24 ENCOUNTER — Encounter: Payer: Self-pay | Admitting: Radiation Oncology

## 2014-06-24 ENCOUNTER — Encounter (HOSPITAL_COMMUNITY): Payer: Self-pay

## 2014-06-24 ENCOUNTER — Ambulatory Visit (HOSPITAL_COMMUNITY)
Admission: RE | Admit: 2014-06-24 | Discharge: 2014-06-24 | Disposition: A | Discharge status: Home / Self Care | Payer: Medicare PPO | Source: Ambulatory Visit | Attending: Radiation Oncology | Admitting: Radiation Oncology

## 2014-06-24 DIAGNOSIS — Z87891 Personal history of nicotine dependence: Secondary | ICD-10-CM | POA: Diagnosis not present

## 2014-06-24 DIAGNOSIS — C3432 Malignant neoplasm of lower lobe, left bronchus or lung: Secondary | ICD-10-CM | POA: Insufficient documentation

## 2014-06-24 LAB — GLUCOSE, CAPILLARY: GLUCOSE-CAPILLARY: 124 mg/dL — AB (ref 65–99)

## 2014-06-24 MED ORDER — FLUDEOXYGLUCOSE F - 18 (FDG) INJECTION
6.4200 | Freq: Once | INTRAVENOUS | Status: AC | PRN
  Administered 2014-06-24: 6.42 via INTRAVENOUS

## 2014-06-24 NOTE — Progress Notes (Signed)
CC: Dr. Modesto Charon, Dr. Velvet Bathe, Dr. Tyler Pita   Chart note: Ms. Gasner had her PET scan earlier today.  She has a hypermetabolic 2.4 cm lower left paratracheal lymph node concerning for metastatic disease.  The previously noted left lower lobe nodule is no longer identified.  There is no hypermetabolic activity at the site of her left lower lobe primary, nor is there evidence for metastatic disease elsewhere.  I spoke with the patient's daughter, Carlyon Shadow, and she is interested in consultation with Dr. Tyler Pita for consideration of treatment to her mediastinum at the [facility].

## 2014-06-28 ENCOUNTER — Telehealth: Payer: Self-pay | Admitting: *Deleted

## 2014-06-28 NOTE — Telephone Encounter (Signed)
Called patient to inform of appt. With Dr. Tammi Klippel in [place] on 07-08-14-- @ 8:45 am and possible sim on 07-08-14 @ 10:15 am, spoke with patient and she is aware of this appt.

## 2014-08-11 ENCOUNTER — Ambulatory Visit (INDEPENDENT_AMBULATORY_CARE_PROVIDER_SITE_OTHER): Payer: Medicare PPO | Admitting: Cardiology

## 2014-08-11 ENCOUNTER — Encounter: Payer: Self-pay | Admitting: Cardiology

## 2014-08-11 VITALS — BP 90/54 | HR 66 | Ht 59.0 in | Wt 127.6 lb

## 2014-08-11 DIAGNOSIS — I10 Essential (primary) hypertension: Secondary | ICD-10-CM | POA: Diagnosis not present

## 2014-08-11 DIAGNOSIS — I951 Orthostatic hypotension: Secondary | ICD-10-CM | POA: Diagnosis not present

## 2014-08-11 DIAGNOSIS — I739 Peripheral vascular disease, unspecified: Secondary | ICD-10-CM | POA: Diagnosis not present

## 2014-08-11 DIAGNOSIS — I429 Cardiomyopathy, unspecified: Secondary | ICD-10-CM | POA: Diagnosis not present

## 2014-08-11 DIAGNOSIS — I428 Other cardiomyopathies: Secondary | ICD-10-CM

## 2014-08-11 MED ORDER — LOSARTAN POTASSIUM 25 MG PO TABS: ORAL_TABLET | ORAL | Status: DC

## 2014-08-11 MED ORDER — FUROSEMIDE 20 MG PO TABS: 20.0000 mg | ORAL_TABLET | Freq: Every day | ORAL | Status: AC

## 2014-08-11 MED ORDER — CARVEDILOL 3.125 MG PO TABS: 3.1250 mg | ORAL_TABLET | Freq: Two times a day (BID) | ORAL | Status: AC

## 2014-08-11 NOTE — Assessment & Plan Note (Signed)
Diagnosed in 2014-currently on her second round of radiation Rx

## 2014-08-11 NOTE — Assessment & Plan Note (Signed)
EF 40-45% in Oct 2014- improved to 50-55% Aug 2015

## 2014-08-11 NOTE — Progress Notes (Signed)
08/11/2014 Alicia Davidson   Nov 22, 1930  [id number]  Primary Physician Alicia Davidson Alicia Jews, MD Primary Cardiologist: Dr Alicia Davidson  HPI:  Pleasant 81 y/o female, here with her daughter, referred by Radiation Oncology for symptomatic orthostatic hypotension. The pt is in her second round of radiation Rx for lung CA. She says she has been eating well but loosing some wgt. She occasional gets dizzy when standing but has not had syncope. She has chronic DOE, no change.    Current Outpatient Prescriptions  Medication Sig Dispense Refill  . Ascorbic Acid (VITAMIN C) 1000 MG tablet Take 1,000 mg by mouth 2 (two) times daily.    Marland Kitchen aspirin 81 MG tablet Take 81 mg by mouth daily.    Marland Kitchen atorvastatin (LIPITOR) 20 MG tablet Take 20 mg by mouth at bedtime.    . calcium carbonate (OS-CAL) 600 MG TABS tablet Take 600 mg by mouth daily.    Marland Kitchen Fe Bisgly-Succ-C-Thre-B12-FA (IRON 21/7 PO) Take 65 mg elemental calcium/kg/hr by mouth 2 (two) times daily.    . folic acid (FOLVITE) 614 MCG tablet Take 400 mcg by mouth daily.    Marland Kitchen HYDROcodone-acetaminophen (NORCO/VICODIN) 5-325 MG per tablet Take 1 tablet by mouth every 6 (six) hours as needed. For pain    . ipratropium-albuterol (DUONEB) 0.5-2.5 (3) MG/3ML SOLN Take 3 mLs by nebulization every 6 (six) hours as needed (shortness of breath).     Marland Kitchen levalbuterol (XOPENEX HFA) 45 MCG/ACT inhaler Inhale 2 puffs into the lungs every 6 (six) hours as needed for shortness of breath.     . methylPREDNIsolone (MEDROL DOSPACK) 4 MG tablet Take 4-24 mg by mouth See admin instructions. 6 day course COMPLETED on 02/03/2014    . Multiple Vitamins-Minerals (CENTRUM SILVER) tablet Take 1 tablet by mouth daily.    Marland Kitchen omeprazole (PRILOSEC OTC) 20 MG tablet Take 40 mg by mouth daily.    . potassium chloride SA (K-DUR,KLOR-CON) 20 MEQ tablet Take 1 tablet (20 mEq total) by mouth daily. 30 tablet 6  . carvedilol (COREG) 3.125 MG tablet Take 1 tablet (3.125 mg total) by mouth 2 (two) times daily.  180 tablet 3  . furosemide (LASIX) 20 MG tablet Take 1 tablet (20 mg total) by mouth daily. 30 tablet 6  . losartan (COZAAR) 25 MG tablet TAKE ONE TABLET BY MOUTH EVERY NIGHT 90 tablet 3   No current facility-administered medications for this visit.    No Known Allergies  History   Social History  . Marital Status: Widowed    Spouse Name: N/A  . Number of Children: 5  . Years of Education: N/A   Occupational History  . Retired    Social History Main Topics  . Smoking status: Former Smoker -- 0.30 packs/day for 56 years    Types: Cigarettes    Quit date: 01/07/2012  . Smokeless tobacco: Never Used  . Alcohol Use: No  . Drug Use: No  . Sexual Activity: Not on file   Other Topics Concern  . Not on file   Social History Narrative     Review of Systems: General: negative for chills, fever, night sweats or weight changes.  Cardiovascular: negative for chest pain, dyspnea on exertion, edema, orthopnea, palpitations, paroxysmal nocturnal dyspnea or shortness of breath Dermatological: negative for rash Respiratory: negative for cough or wheezing Urologic: negative for hematuria Abdominal: negative for nausea, vomiting, diarrhea, bright red blood per rectum, melena, or hematemesis Neurologic: negative for visual changes, syncope, or dizziness All other systems reviewed  and are otherwise negative except as noted above.    Blood pressure 90/54, pulse 66, height '4\' 11"'$  (1.499 m), weight 127 lb 9.6 oz (57.879 kg), SpO2 98 %.  General appearance: alert, cooperative and no distress Neck: no JVD Lungs: decreased Rt > Lt base Heart: regular rate and rhythm Extremities: no edema Skin: pale, cool, dry Neurologic: Grossly normal   ASSESSMENT AND PLAN:   Orthostatic hypotension Referred from Radiation Oncology for symptomatic orthostatic B/P  Lung cancer Diagnosed in 2014-currently on her second round of radiation Rx  Nonischemic cardiomyopathy EF 40-45% in Oct 2014-  improved to 50-55% Aug 2015  PVD (peripheral vascular disease) Asymptomatic moderate bilateral internal CA stenosis by doppler Jan 2015   PLAN  She says she has not had labs recently, I ordered a CBC and BMP. I did adjust her medications- stop Lanoxin, decrease Lasix to 20 mg daily, decrease Coreg to 3.125 mg BID, stop Diovan, start Cozaar 25 mg Q HS. I have asked her to return to be seen by Alicia Davidson in two weeks.   Kerin Ransom K PA-C 08/11/2014 3:26 PM

## 2014-08-11 NOTE — Assessment & Plan Note (Signed)
Referred from Radiation Oncology for symptomatic orthostatic B/P

## 2014-08-11 NOTE — Patient Instructions (Signed)
Your physician recommends that you schedule a follow-up appointment in: [place], NP  Your physician has recommended you make the following change in your medication:   START TAKING: COZAAR 25 MG AT BEDTIME  DECREASE LASIX TO 20 MG DAILY  DECREASE COREG 3.125 DAILY  STOP TAKING: DIOVAN (VALSARTAN) STOP TAKING: LANOXIN ( DIGOXIN)   Your physician recommends that you have lab work done today.  Thank you for choosing [facility]!

## 2014-08-11 NOTE — Assessment & Plan Note (Signed)
Asymptomatic moderate bilateral internal CA stenosis by doppler Jan 2015

## 2014-08-15 ENCOUNTER — Other Ambulatory Visit: Payer: Self-pay | Admitting: Cardiology

## 2014-08-22 ENCOUNTER — Other Ambulatory Visit (HOSPITAL_COMMUNITY)
Admission: RE | Admit: 2014-08-22 | Discharge: 2014-08-22 | Disposition: A | Discharge status: Home / Self Care | Payer: Medicare PPO | Source: Ambulatory Visit | Attending: Cardiology | Admitting: Cardiology

## 2014-08-22 DIAGNOSIS — I10 Essential (primary) hypertension: Secondary | ICD-10-CM | POA: Diagnosis present

## 2014-08-22 LAB — BASIC METABOLIC PANEL
Anion gap: 8 (ref 5–15)
BUN: 20 mg/dL (ref 6–20)
CO2: 29 mmol/L (ref 22–32)
Calcium: 9.3 mg/dL (ref 8.9–10.3)
Chloride: 101 mmol/L (ref 101–111)
Creatinine, Ser: 0.99 mg/dL (ref 0.44–1.00)
GFR calc Af Amer: 60 mL/min — ABNORMAL LOW (ref >60)
GFR calc non Af Amer: 52 mL/min — ABNORMAL LOW (ref >60)
Glucose, Bld: 90 mg/dL (ref 65–99)
Potassium: 3.9 mmol/L (ref 3.5–5.1)
Sodium: 138 mmol/L (ref 135–145)

## 2014-08-22 LAB — CBC WITH DIFFERENTIAL/PLATELET
Basophils Absolute: 0 10*3/uL (ref 0.0–0.1)
Basophils Relative: 0 % (ref 0–1)
Eosinophils Absolute: 0.1 10*3/uL (ref 0.0–0.7)
Eosinophils Relative: 2 % (ref 0–5)
HCT: 36.6 % (ref 36.0–46.0)
Hemoglobin: 11.8 g/dL — ABNORMAL LOW (ref 12.0–15.0)
Lymphocytes Relative: 17 % (ref 12–46)
Lymphs Abs: 1.1 10*3/uL (ref 0.7–4.0)
MCH: 30.1 pg (ref 26.0–34.0)
MCHC: 32.2 g/dL (ref 30.0–36.0)
MCV: 93.4 fL (ref 78.0–100.0)
Monocytes Absolute: 0.6 10*3/uL (ref 0.1–1.0)
Monocytes Relative: 10 % (ref 3–12)
Neutro Abs: 4.7 10*3/uL (ref 1.7–7.7)
Neutrophils Relative %: 71 % (ref 43–77)
Platelets: 303 10*3/uL (ref 150–400)
RBC: 3.92 MIL/uL (ref 3.87–5.11)
RDW: 14.7 % (ref 11.5–15.5)
WBC: 6.6 10*3/uL (ref 4.0–10.5)

## 2014-08-25 ENCOUNTER — Other Ambulatory Visit (HOSPITAL_COMMUNITY)
Admission: RE | Admit: 2014-08-25 | Discharge: 2014-08-25 | Disposition: A | Discharge status: Home / Self Care | Payer: Medicare PPO | Source: Ambulatory Visit | Attending: Adult Health | Admitting: Adult Health

## 2014-08-25 ENCOUNTER — Encounter: Payer: Self-pay | Admitting: Adult Health

## 2014-08-25 ENCOUNTER — Ambulatory Visit (INDEPENDENT_AMBULATORY_CARE_PROVIDER_SITE_OTHER): Payer: Medicare PPO | Admitting: Adult Health

## 2014-08-25 VITALS — BP 124/76 | HR 67 | Ht 59.0 in | Wt 129.0 lb

## 2014-08-25 DIAGNOSIS — R8290 Unspecified abnormal findings in urine: Secondary | ICD-10-CM | POA: Diagnosis not present

## 2014-08-25 DIAGNOSIS — I420 Dilated cardiomyopathy: Secondary | ICD-10-CM | POA: Diagnosis not present

## 2014-08-25 DIAGNOSIS — I951 Orthostatic hypotension: Secondary | ICD-10-CM

## 2014-08-25 LAB — URINALYSIS, ROUTINE W REFLEX MICROSCOPIC
Bilirubin Urine: NEGATIVE
GLUCOSE, UA: NEGATIVE mg/dL
Hgb urine dipstick: NEGATIVE
KETONES UR: NEGATIVE mg/dL
LEUKOCYTES UA: NEGATIVE
Nitrite: POSITIVE — AB
PROTEIN: NEGATIVE mg/dL
Specific Gravity, Urine: 1.01 (ref 1.005–1.030)
UROBILINOGEN UA: 0.2 mg/dL (ref 0.0–1.0)
pH: 6 (ref 5.0–8.0)

## 2014-08-25 LAB — URINE MICROSCOPIC-ADD ON

## 2014-08-25 MED ORDER — CIPROFLOXACIN HCL 250 MG PO TABS: 250.0000 mg | ORAL_TABLET | Freq: Two times a day (BID) | ORAL | Status: DC

## 2014-08-25 NOTE — Progress Notes (Signed)
Name: Alicia Davidson    DOB: October 03, 1930  Age: 81 y.o.  MR#: [id number]       PCP:  Alonza Bogus, MD      Insurance: Payor: [employer] MEDICARE / Plan: [employer] MEDICARE CHOICE PPO / Product Type: *No Product type* /   CC:    Chief Complaint  Patient presents with  . Hypotension  . Cardiomyopathy  . PVD    VS Filed Vitals:   08/25/14 1349  BP: 124/76  Pulse: 67  Height: '4\' 11"'$  (1.499 m)  Weight: 129 lb (58.514 kg)  SpO2: 91%    Weights Current Weight  08/25/14 129 lb (58.514 kg)  08/11/14 127 lb 9.6 oz (57.879 kg)  06/14/14 129 lb 14.4 oz (58.922 kg)    Blood Pressure  BP Readings from Last 3 Encounters:  08/25/14 124/76  08/11/14 90/54  06/14/14 167/58     Admit date:  (Not on file) Last encounter with RMR:  03/30/2014   Allergy Review of patient's allergies indicates no known allergies.  Current Outpatient Prescriptions  Medication Sig Dispense Refill  . Ascorbic Acid (VITAMIN C) 1000 MG tablet Take 1,000 mg by mouth 2 (two) times daily.    Marland Kitchen aspirin 81 MG tablet Take 81 mg by mouth daily.    Marland Kitchen atorvastatin (LIPITOR) 20 MG tablet Take 20 mg by mouth at bedtime.    . calcium carbonate (OS-CAL) 600 MG TABS tablet Take 600 mg by mouth daily.    . carvedilol (COREG) 3.125 MG tablet Take 1 tablet (3.125 mg total) by mouth 2 (two) times daily. 180 tablet 3  . Fe Bisgly-Succ-C-Thre-B12-FA (IRON 21/7 PO) Take 65 mg elemental calcium/kg/hr by mouth 2 (two) times daily.    . folic acid (FOLVITE) 381 MCG tablet Take 400 mcg by mouth daily.    . furosemide (LASIX) 20 MG tablet Take 1 tablet (20 mg total) by mouth daily. 30 tablet 6  . HYDROcodone-acetaminophen (NORCO/VICODIN) 5-325 MG per tablet Take 1 tablet by mouth every 6 (six) hours as needed. For pain    . ipratropium-albuterol (DUONEB) 0.5-2.5 (3) MG/3ML SOLN Take 3 mLs by nebulization every 6 (six) hours as needed (shortness of breath).     Marland Kitchen KLOR-CON M20 20 MEQ tablet TAKE ONE TABLET BY MOUTH ONCE DAILY 30 tablet 3  .  levalbuterol (XOPENEX HFA) 45 MCG/ACT inhaler Inhale 2 puffs into the lungs every 6 (six) hours as needed for shortness of breath.     . losartan (COZAAR) 25 MG tablet TAKE ONE TABLET BY MOUTH EVERY NIGHT 90 tablet 3  . methylPREDNIsolone (MEDROL DOSPACK) 4 MG tablet Take 4-24 mg by mouth See admin instructions. 6 day course COMPLETED on 02/03/2014    . Multiple Vitamins-Minerals (CENTRUM SILVER) tablet Take 1 tablet by mouth daily.    Marland Kitchen omeprazole (PRILOSEC OTC) 20 MG tablet Take 40 mg by mouth daily.     No current facility-administered medications for this visit.    Discontinued Meds:   There are no discontinued medications.  Patient Active Problem List   Diagnosis Date Noted  . Orthostatic hypotension 08/11/2014  . PVD (peripheral vascular disease) 08/11/2014  . Nonischemic cardiomyopathy 09/01/2013  . Lung cancer   . Squamous cell carcinoma lung 01/26/2013  . Diastolic dysfunction, left ventricle 01/26/2013  . Chronic combined systolic and diastolic heart failure 01/75/1025  . Mitral regurgitation 05/11/2012  . Dyspnea 08/27/2011  . COPD GOLD II 08/27/2011  . Smoking 08/27/2011  . Anemia 08/27/2011  . Essential hypertension, benign  08/27/2011    LABS    Component Value Date/Time   NA 138 08/22/2014 1515   NA 137 02/04/2014 1930   NA 141 09/25/2013 1040   K 3.9 08/22/2014 1515   K 3.6 02/04/2014 1930   K 4.1 09/25/2013 1040   CL 101 08/22/2014 1515   CL 102 02/04/2014 1930   CL 100 09/25/2013 1040   CO2 29 08/22/2014 1515   CO2 27 02/04/2014 1930   CO2 28 09/25/2013 1040   GLUCOSE 90 08/22/2014 1515   GLUCOSE 115* 02/04/2014 1930   GLUCOSE 126* 09/25/2013 1040   BUN 20 08/22/2014 1515   BUN 31* 02/04/2014 1930   BUN 16 09/25/2013 1040   CREATININE 0.99 08/22/2014 1515   CREATININE 1.20* 03/10/2014 1456   CREATININE 1.11* 02/04/2014 1930   CREATININE 1.05 07/07/2012 1000   CREATININE 1.11* 05/21/2012 0250   CALCIUM 9.3 08/22/2014 1515   CALCIUM 9.0 02/04/2014  1930   CALCIUM 9.2 09/25/2013 1040   GFRNONAA 52* 08/22/2014 1515   GFRNONAA 45* 02/04/2014 1930   GFRNONAA 54* 09/25/2013 1040   GFRAA 60* 08/22/2014 1515   GFRAA 53* 02/04/2014 1930   GFRAA 63* 09/25/2013 1040   CMP     Component Value Date/Time   NA 138 08/22/2014 1515   K 3.9 08/22/2014 1515   CL 101 08/22/2014 1515   CO2 29 08/22/2014 1515   GLUCOSE 90 08/22/2014 1515   BUN 20 08/22/2014 1515   CREATININE 0.99 08/22/2014 1515   CREATININE 1.05 07/07/2012 1000   CALCIUM 9.3 08/22/2014 1515   PROT 7.1 02/04/2014 1930   ALBUMIN 3.9 02/04/2014 1930   AST 21 02/04/2014 1930   ALT 26 02/04/2014 1930   ALKPHOS 67 02/04/2014 1930   BILITOT 0.5 02/04/2014 1930   GFRNONAA 52* 08/22/2014 1515   GFRAA 60* 08/22/2014 1515       Component Value Date/Time   WBC 6.6 08/22/2014 1515   WBC 8.9 02/04/2014 1930   WBC 8.2 09/25/2013 1040   HGB 11.8* 08/22/2014 1515   HGB 12.4 02/04/2014 1930   HGB 12.0 09/25/2013 1040   HCT 36.6 08/22/2014 1515   HCT 39.4 02/04/2014 1930   HCT 36.6 09/25/2013 1040   MCV 93.4 08/22/2014 1515   MCV 96.6 02/04/2014 1930   MCV 93.8 09/25/2013 1040    Lipid Panel  No results found for: CHOL, TRIG, HDL, CHOLHDL, VLDL, LDLCALC, LDLDIRECT  ABG    Component Value Date/Time   PHART 7.444 03/15/2013 1605   PCO2ART 34.5* 03/15/2013 1605   PO2ART 79.1* 03/15/2013 1605   HCO3 23.3 03/15/2013 1605   TCO2 21.2 03/15/2013 1605   ACIDBASEDEF 0.3 03/15/2013 1605   O2SAT 95.8 03/15/2013 1605     Lab Results  Component Value Date   TSH 0.96 08/27/2011   BNP (last 3 results) No results for input(s): BNP in the last 8760 hours.  ProBNP (last 3 results) No results for input(s): PROBNP in the last 8760 hours.  Cardiac Panel (last 3 results) No results for input(s): CKTOTAL, CKMB, TROPONINI, RELINDX in the last 72 hours.  Iron/TIBC/Ferritin/ %Sat No results found for: IRON, TIBC, FERRITIN, IRONPCTSAT   EKG Orders placed or performed during the  hospital encounter of 02/04/14  . ED EKG  . ED EKG  . EKG 12-Lead  . EKG 12-Lead  . EKG     Prior Assessment and Plan Problem List as of 08/25/2014      Cardiovascular and Mediastinum   Essential hypertension, benign  Last Assessment & Plan 11/26/2013 Office Visit Written 11/26/2013  4:29 PM by Satira Sark, MD    Blood pressure is normal today. Continue current regimen.      Chronic combined systolic and diastolic heart failure   Last Assessment & Plan 02/18/2013 Office Visit Written 02/18/2013  1:38 PM by Lendon Colonel, NP    Blood pressure is slightly low today. I rechecked it manually and this correlated with triage recordings. She was NPO for 24 hours yesterday prior to biopsy. She has not been eating or drinking well since. I suspect that she may be a little dehydrated. I will hold her lasix for two days and then resume. If BP stays low, they are to call us.   I will see her again in one month for reassessment of her BP and recommendations for adjustments in antihypertensives if necessary.      Mitral regurgitation   Last Assessment & Plan 06/16/2012 Office Visit Edited 06/16/2012 12:15 PM by Lendon Colonel, NP    Per echocardiogram the patient was found to have moderate mitral regurg, with a moderately dilated left atrium and mildly to markedly dilated right ventricle. Continue heart rate and blood pressure control.      Diastolic dysfunction, left ventricle   Nonischemic cardiomyopathy   Last Assessment & Plan 08/11/2014 Office Visit Written 08/11/2014  3:25 PM by Erlene Quan, PA-C    EF 40-45% in Oct 2014- improved to 50-55% Aug 2015      Orthostatic hypotension   Last Assessment & Plan 08/11/2014 Office Visit Written 08/11/2014  3:23 PM by Erlene Quan, PA-C    Referred from Radiation Oncology for symptomatic orthostatic B/P      PVD (peripheral vascular disease)   Last Assessment & Plan 08/11/2014 Office Visit Written 08/11/2014  3:26 PM by Erlene Quan, PA-C     Asymptomatic moderate bilateral internal CA stenosis by doppler Jan 2015        Respiratory   COPD GOLD II   Last Assessment & Plan 09/01/2013 Office Visit Written 09/01/2013 11:32 AM by Satira Sark, MD    Keep followup with pulmonary.      Squamous cell carcinoma lung   Last Assessment & Plan 09/01/2013 Office Visit Written 09/01/2013 11:31 AM by Satira Sark, MD    History reviewed above.      Lung cancer   Last Assessment & Plan 08/11/2014 Office Visit Written 08/11/2014  3:24 PM by Erlene Quan, PA-C    Diagnosed in 2014-currently on her second round of radiation Rx        Other   Dyspnea   Last Assessment & Plan 08/03/2012 Office Visit Written 08/03/2012  4:41 PM by Lendon Colonel, NP    This is related to chronic COPD. There is no wheezes or productive coughing on exam. No changes in her medicines.      Smoking   Last Assessment & Plan 10/14/2011 Office Visit Written 10/14/2011  3:24 PM by Tanda Rockers, MD    I reviewed the Flethcher curve with patient that basically indicates  if you quit smoking when your best day FEV1 is still well preserved (which hers is) it is highly unlikely you will progress to severe disease and informed the patient there was no medication on the market that has proven to change the curve or the likelihood of progression.  Therefore stopping smoking and maintaining abstinence is the most important aspect of care, not choice of  inhalers or for that matter, doctors.   Pulmonary f/u can be prn      Anemia   Last Assessment & Plan 08/27/2011 Office Visit Written 08/27/2011  3:22 PM by Tanda Rockers, MD     Lab 08/27/11 1231  HGB 8.6 Repeated and verified X2.*    Normocytic with recent gi w/u neg in [place], should continue Fe for now and f/u with primary          Imaging: No results found.

## 2014-08-25 NOTE — Progress Notes (Signed)
Cardiology Office Note   Date:  08/25/2014   ID:  Alicia Davidson, DOB 09-Sep-1930, MRN [id number]  PCP:  Alicia Bogus, MD  Cardiologist:  Alicia Davidson/ Alicia Sims, NP   Chief Complaint  Patient presents with  . Hypotension  . Cardiomyopathy  . PVD      History of Present Illness: Alicia Davidson is a 81 y.o. female who presents for ongoing assessment and management of systemic hypotension, with history of nonischemic cardiomyopathy, peripheral vascular disease. Moderate bilateral internal coronary artery stenosis by Doppler in January 2015, who also has a history of lung cancer and is undergoing second round of radiation. She was last seen in the office on 8 4 2016 in the setting of orthostatic hypotension. She was seen by Alicia Ransom, PA, at that time her medications were adjusted. Lanoxin was discontinued. Her Lasix was decreased to 20 mg daily, and her Coreg was decreased to 3.125 mg twice a day. She is here for follow-up  Labs completed on 08/25/2014: Sodium 138, potassium 3.9, chloride 101, CO2 29, glucose 90, BUN 20, creatinine 0.99, hemoglobin 11.8, hematocrit 36.6, white blood cell 6.6, platelets 303.  She is complaining of malodorous urine, but no dysuria. She denies chest pain or worsening dyspnea. She is feeling better with medications changes.   Past Medical History  Diagnosis Date  . COPD (chronic obstructive pulmonary disease)   . Essential hypertension, benign   . Hypercholesteremia   . Osteoporosis   . B12 deficiency   . Anemia associated with acute blood loss   . Gastric ulcer with hemorrhage but without obstruction   . Weight loss, non-intentional   . Anxiety   . Umbilical hernia   . Gastritis     Mild  . Chronic systolic heart failure     Diagnosed [place] March 2014  . Cancer   . Cardiomyopathy     Probable nonischemic, LVEF 40-45%  . Lung cancer 02/17/13    left lower lobe  . Hx of radiation therapy 03/29/13- 04/05/13    SBRT- LLL lung 5400 cGy in 3  sessions  . Dyspnea     chronic    Past Surgical History  Procedure Laterality Date  . Lung biopsy Left 02/17/13    LLL  . Tubal ligation       Current Outpatient Prescriptions  Medication Sig Dispense Refill  . Ascorbic Acid (VITAMIN C) 1000 MG tablet Take 1,000 mg by mouth 2 (two) times daily.    Marland Kitchen aspirin 81 MG tablet Take 81 mg by mouth daily.    Marland Kitchen atorvastatin (LIPITOR) 20 MG tablet Take 20 mg by mouth at bedtime.    . calcium carbonate (OS-CAL) 600 MG TABS tablet Take 600 mg by mouth daily.    . carvedilol (COREG) 3.125 MG tablet Take 1 tablet (3.125 mg total) by mouth 2 (two) times daily. 180 tablet 3  . Fe Bisgly-Succ-C-Thre-B12-FA (IRON 21/7 PO) Take 65 mg elemental calcium/kg/hr by mouth 2 (two) times daily.    . folic acid (FOLVITE) 623 MCG tablet Take 400 mcg by mouth daily.    . furosemide (LASIX) 20 MG tablet Take 1 tablet (20 mg total) by mouth daily. 30 tablet 6  . HYDROcodone-acetaminophen (NORCO/VICODIN) 5-325 MG per tablet Take 1 tablet by mouth every 6 (six) hours as needed. For pain    . ipratropium-albuterol (DUONEB) 0.5-2.5 (3) MG/3ML SOLN Take 3 mLs by nebulization every 6 (six) hours as needed (shortness of breath).     Marland Kitchen KLOR-CON M20  20 MEQ tablet TAKE ONE TABLET BY MOUTH ONCE DAILY 30 tablet 3  . levalbuterol (XOPENEX HFA) 45 MCG/ACT inhaler Inhale 2 puffs into the lungs every 6 (six) hours as needed for shortness of breath.     . losartan (COZAAR) 25 MG tablet TAKE ONE TABLET BY MOUTH EVERY NIGHT 90 tablet 3  . methylPREDNIsolone (MEDROL DOSPACK) 4 MG tablet Take 4-24 mg by mouth See admin instructions. 6 day course COMPLETED on 02/03/2014    . Multiple Vitamins-Minerals (CENTRUM SILVER) tablet Take 1 tablet by mouth daily.    Marland Kitchen omeprazole (PRILOSEC OTC) 20 MG tablet Take 40 mg by mouth daily.     No current facility-administered medications for this visit.    Allergies:   Review of patient's allergies indicates no known allergies.    Social History:   The patient  reports that she quit smoking about 2 years ago. Her smoking use included Cigarettes. She has a 16.8 pack-year smoking history. She has never used smokeless tobacco. She reports that she does not drink alcohol or use illicit drugs.   Family History:  The patient's family history includes Colon cancer in her sister; Heart disease in her father; Kidney failure in her brother; Other in her mother.    ROS: .   All other systems are reviewed and negative.Unless otherwise mentioned in H&P above.   PHYSICAL EXAM: VS:  There were no vitals taken for this visit. , BMI There is no weight on file to calculate BMI. GEN: Well nourished, well developed, in no acute distress HEENT: normal Neck: no JVD, carotid bruits, or masses Cardiac: RRR; no murmurs, rubs, or gallops,no edema  Respiratory:  clear to auscultation bilaterally, normal work of breathing GI: soft, nontender, nondistended, + BS MS: no deformity or atrophy Skin: warm and dry, no rash Neuro:  Strength and sensation are intact Psych: euthymic mood, full affect   Recent Labs: 02/04/2014: ALT 26 08/22/2014: BUN 20; Creatinine, Ser 0.99; Hemoglobin 11.8*; Platelets 303; Potassium 3.9; Sodium 138    Lipid Panel No results found for: CHOL, TRIG, HDL, CHOLHDL, VLDL, LDLCALC, LDLDIRECT    Wt Readings from Last 3 Encounters:  08/11/14 127 lb 9.6 oz (57.879 kg)  06/14/14 129 lb 14.4 oz (58.922 kg)  02/04/14 135 lb (61.236 kg)      Other studies Reviewed: Additional studies/ records that were reviewed today include: NA Review of the above records demonstrates: NA  ASSESSMENT AND PLAN:  1.  NICM:No complaints currently. Will continue current medication regimen. She is better concerning her BP and is no longer symptomatic.   2. Dysuria: Will order a U/A and give Rx for Cipro 250 mg BID for 7 days. If U/A is normal she is not take.   3. Lung Cancer: Followed by oncology.   Current medicines are reviewed at length with the  patient today.    Labs/ tests ordered today include: U/A    Disposition:   FU with 6 months  Signed, Alicia Sims, NP  08/25/2014 7:15 AM    [facility]. [address] [place], [place] [address] Phone: [phone]; Fax: [phone]

## 2014-08-25 NOTE — Patient Instructions (Signed)
Your physician wants you to follow-up in: 6 months with Arnold Long, NP. You will receive a reminder letter in the mail two months in advance. If you don't receive a letter, please call our office to schedule the follow-up appointment.  START TAKING: Cipro 250 mg Two Times Daily for 7 Days   Please have U/A done   Thank you for choosing [facility]!

## 2014-09-22 ENCOUNTER — Other Ambulatory Visit (HOSPITAL_COMMUNITY): Payer: Self-pay | Admitting: Oncology

## 2014-09-22 DIAGNOSIS — C3432 Malignant neoplasm of lower lobe, left bronchus or lung: Secondary | ICD-10-CM

## 2014-09-23 ENCOUNTER — Other Ambulatory Visit: Payer: Self-pay | Admitting: Radiation Oncology

## 2014-09-23 DIAGNOSIS — C3432 Malignant neoplasm of lower lobe, left bronchus or lung: Secondary | ICD-10-CM

## 2014-09-26 ENCOUNTER — Ambulatory Visit (HOSPITAL_COMMUNITY)
Admission: RE | Admit: 2014-09-26 | Discharge: 2014-09-26 | Disposition: A | Discharge status: Home / Self Care | Payer: Medicare PPO | Source: Ambulatory Visit | Attending: Radiation Oncology | Admitting: Radiation Oncology

## 2014-09-26 ENCOUNTER — Encounter (HOSPITAL_COMMUNITY): Payer: Self-pay

## 2014-09-26 DIAGNOSIS — C3432 Malignant neoplasm of lower lobe, left bronchus or lung: Secondary | ICD-10-CM | POA: Diagnosis present

## 2014-09-26 DIAGNOSIS — R590 Localized enlarged lymph nodes: Secondary | ICD-10-CM | POA: Diagnosis not present

## 2014-09-26 LAB — POCT I-STAT CREATININE: Creatinine, Ser: 1.2 mg/dL — ABNORMAL HIGH (ref 0.44–1.00)

## 2014-09-26 MED ORDER — IOHEXOL 300 MG/ML  SOLN
75.0000 mL | Freq: Once | INTRAMUSCULAR | Status: AC | PRN
  Administered 2014-09-26: 60 mL via INTRAVENOUS

## 2014-09-29 ENCOUNTER — Other Ambulatory Visit: Payer: Self-pay | Admitting: Radiation Oncology

## 2014-09-29 DIAGNOSIS — C3490 Malignant neoplasm of unspecified part of unspecified bronchus or lung: Secondary | ICD-10-CM

## 2014-12-12 ENCOUNTER — Ambulatory Visit (HOSPITAL_COMMUNITY)
Admission: RE | Admit: 2014-12-12 | Discharge: 2014-12-12 | Disposition: A | Discharge status: Home / Self Care | Payer: Medicare PPO | Source: Ambulatory Visit | Attending: Radiation Oncology | Admitting: Radiation Oncology

## 2014-12-12 DIAGNOSIS — C3490 Malignant neoplasm of unspecified part of unspecified bronchus or lung: Secondary | ICD-10-CM

## 2014-12-12 DIAGNOSIS — Z08 Encounter for follow-up examination after completed treatment for malignant neoplasm: Secondary | ICD-10-CM | POA: Diagnosis not present

## 2014-12-12 DIAGNOSIS — I771 Stricture of artery: Secondary | ICD-10-CM | POA: Insufficient documentation

## 2014-12-12 DIAGNOSIS — Z923 Personal history of irradiation: Secondary | ICD-10-CM | POA: Insufficient documentation

## 2014-12-12 DIAGNOSIS — C3432 Malignant neoplasm of lower lobe, left bronchus or lung: Secondary | ICD-10-CM | POA: Diagnosis not present

## 2014-12-12 DIAGNOSIS — I2510 Atherosclerotic heart disease of native coronary artery without angina pectoris: Secondary | ICD-10-CM | POA: Insufficient documentation

## 2014-12-12 DIAGNOSIS — R590 Localized enlarged lymph nodes: Secondary | ICD-10-CM | POA: Insufficient documentation

## 2014-12-12 LAB — POCT I-STAT CREATININE: Creatinine, Ser: 1.2 mg/dL — ABNORMAL HIGH (ref 0.44–1.00)

## 2014-12-12 MED ORDER — IOHEXOL 300 MG/ML  SOLN
64.0000 mL | Freq: Once | INTRAMUSCULAR | Status: AC | PRN
  Administered 2014-12-12: 64 mL via INTRAVENOUS

## 2014-12-14 ENCOUNTER — Other Ambulatory Visit: Payer: Self-pay | Admitting: Cardiology

## 2014-12-14 NOTE — Telephone Encounter (Signed)
REFILL 

## 2014-12-20 ENCOUNTER — Other Ambulatory Visit: Payer: Self-pay | Admitting: Adult Health

## 2014-12-23 ENCOUNTER — Other Ambulatory Visit: Payer: Self-pay | Admitting: Radiation Oncology

## 2014-12-23 DIAGNOSIS — C3490 Malignant neoplasm of unspecified part of unspecified bronchus or lung: Secondary | ICD-10-CM

## 2015-03-17 ENCOUNTER — Ambulatory Visit (HOSPITAL_COMMUNITY)
Admission: RE | Admit: 2015-03-17 | Discharge: 2015-03-17 | Disposition: A | Discharge status: Home / Self Care | Payer: Medicare PPO | Source: Ambulatory Visit | Attending: Radiation Oncology | Admitting: Radiation Oncology

## 2015-03-17 DIAGNOSIS — J439 Emphysema, unspecified: Secondary | ICD-10-CM | POA: Diagnosis not present

## 2015-03-17 DIAGNOSIS — C3490 Malignant neoplasm of unspecified part of unspecified bronchus or lung: Secondary | ICD-10-CM

## 2015-03-17 DIAGNOSIS — R911 Solitary pulmonary nodule: Secondary | ICD-10-CM | POA: Diagnosis not present

## 2015-03-17 LAB — POCT I-STAT CREATININE: CREATININE: 1 mg/dL (ref 0.44–1.00)

## 2015-03-17 MED ORDER — IOHEXOL 300 MG/ML  SOLN
75.0000 mL | Freq: Once | INTRAMUSCULAR | Status: AC | PRN
  Administered 2015-03-17: 75 mL via INTRAVENOUS

## 2015-04-06 ENCOUNTER — Other Ambulatory Visit: Payer: Self-pay | Admitting: Radiation Oncology

## 2015-04-06 DIAGNOSIS — C3432 Malignant neoplasm of lower lobe, left bronchus or lung: Secondary | ICD-10-CM

## 2015-04-07 ENCOUNTER — Ambulatory Visit (INDEPENDENT_AMBULATORY_CARE_PROVIDER_SITE_OTHER): Payer: Medicare PPO | Admitting: Adult Health

## 2015-04-07 ENCOUNTER — Encounter: Payer: Self-pay | Admitting: Adult Health

## 2015-04-07 VITALS — BP 98/60 | HR 84 | Ht 59.0 in | Wt 142.0 lb

## 2015-04-07 DIAGNOSIS — I10 Essential (primary) hypertension: Secondary | ICD-10-CM

## 2015-04-07 DIAGNOSIS — D509 Iron deficiency anemia, unspecified: Secondary | ICD-10-CM

## 2015-04-07 NOTE — Progress Notes (Signed)
Cardiology Office Note   Date:  04/07/2015   ID:  Alicia Davidson, DOB 17-Oct-1930, MRN [id number]  PCP:  Alonza Bogus, MD  Cardiologist: [place]/  Jory Sims, NP   No chief complaint on file.     History of Present Illness: Alicia Davidson is a 82 y.o. female who presents for ongoing assessment and management of systemic hypotension, history of nonischemic cardiomyopathy, peripheral vascular disease, moderate bilateral ICA per Doppler in January 2015, with a history of lung cancer, undergoing radiation.  She was last seen in the office in August of 2016, with no cardiology complaints.  She is without complaint today.  She continues to have issues with her lungs and is being followed by Dr. Luan Pulling for ongoing management of this.  She denies any chest pain, dizziness, or fatigue.  Past Medical History  Diagnosis Date  . COPD (chronic obstructive pulmonary disease) ([place])   . Essential hypertension, benign   . Hypercholesteremia   . Osteoporosis   . B12 deficiency   . Anemia associated with acute blood loss   . Gastric ulcer with hemorrhage but without obstruction   . Weight loss, non-intentional   . Anxiety   . Umbilical hernia   . Gastritis     Mild  . Chronic systolic heart failure [facility])     Diagnosed [place] March 2014  . Cancer ([place])   . Cardiomyopathy ([place])     Probable nonischemic, LVEF 40-45%  . Lung cancer ([place]) 02/17/13    left lower lobe  . Hx of radiation therapy 03/29/13- 04/05/13    SBRT- LLL lung 5400 cGy in 3 sessions  . Dyspnea     chronic    Past Surgical History  Procedure Laterality Date  . Lung biopsy Left 02/17/13    LLL  . Tubal ligation       Current Outpatient Prescriptions  Medication Sig Dispense Refill  . omeprazole (PRILOSEC) 20 MG capsule Take 20 mg by mouth daily.    . Ascorbic Acid (VITAMIN C) 1000 MG tablet Take 1,000 mg by mouth 2 (two) times daily.    Marland Kitchen aspirin 81 MG tablet Take 81 mg by mouth daily.    Marland Kitchen atorvastatin (LIPITOR)  20 MG tablet Take 20 mg by mouth at bedtime.    . calcium carbonate (OS-CAL) 600 MG TABS tablet Take 600 mg by mouth daily.    . carvedilol (COREG) 3.125 MG tablet Take 1 tablet (3.125 mg total) by mouth 2 (two) times daily. 180 tablet 3  . Fe Bisgly-Succ-C-Thre-B12-FA (IRON 21/7 PO) Take 65 mg elemental calcium/kg/hr by mouth 2 (two) times daily.    . folic acid (FOLVITE) 782 MCG tablet Take 400 mcg by mouth daily.    . furosemide (LASIX) 20 MG tablet Take 1 tablet (20 mg total) by mouth daily. 30 tablet 6  . ipratropium-albuterol (DUONEB) 0.5-2.5 (3) MG/3ML SOLN Take 3 mLs by nebulization every 6 (six) hours as needed (shortness of breath).     Marland Kitchen KLOR-CON M20 20 MEQ tablet TAKE ONE TABLET BY MOUTH ONCE DAILY 30 tablet 4  . levalbuterol (XOPENEX HFA) 45 MCG/ACT inhaler Inhale 2 puffs into the lungs every 6 (six) hours as needed for shortness of breath.     . losartan (COZAAR) 25 MG tablet TAKE ONE TABLET BY MOUTH EVERY NIGHT 90 tablet 3  . Multiple Vitamins-Minerals (CENTRUM SILVER) tablet Take 1 tablet by mouth daily.     No current facility-administered medications for this visit.    Allergies:  Review of patient's allergies indicates no known allergies.    Social History:  The patient  reports that she quit smoking about 3 years ago. Her smoking use included Cigarettes. She has a 16.8 pack-year smoking history. She has never used smokeless tobacco. She reports that she does not drink alcohol or use illicit drugs.   Family History:  The patient's family history includes Colon cancer in her sister; Heart disease in her father; Kidney failure in her brother; Other in her mother.    ROS: All other systems are reviewed and negative. Unless otherwise mentioned in H&P    PHYSICAL EXAM: VS:  BP 98/60 mmHg  Pulse 84  Ht '4\' 11"'$  (1.499 m)  Wt 142 lb (64.411 kg)  BMI 28.67 kg/m2  SpO2 97% , BMI Body mass index is 28.67 kg/(m^2). GEN: Well nourished, well developed, in no acute  distress HEENT: normal Neck: no JVD, carotid bruits, or masses Cardiac: RRR; no murmurs, rubs, or gallops,no edema  Respiratory: bilateral inspiratory wheezes.  No coughing.  GI: soft, nontender, nondistended, + BS MS: no deformity or atrophymild varicosities in the feet. Skin: warm and dry, no rash Neuro:  Strength and sensation are intact Psych: euthymic mood, full affect   EKG:  The ekg ordered today demonstrates normal sinus rhythm, evidence of old anterior septal infarct, with T-wave inversion in the high lateral leads, with some depression noted in V6.  Heart rate of 84 beats per minute.   Recent Labs: 08/22/2014: BUN 20; Hemoglobin 11.8*; Platelets 303; Potassium 3.9; Sodium 138 03/17/2015: Creatinine, Ser 1.00    Lipid Panel No results found for: CHOL, TRIG, HDL, CHOLHDL, VLDL, LDLCALC, LDLDIRECT    Wt Readings from Last 3 Encounters:  04/07/15 142 lb (64.411 kg)  08/25/14 129 lb (58.514 kg)  08/11/14 127 lb 9.6 oz (57.879 kg)       ASSESSMENT AND PLAN:  1. Hypertension: I rechecked her blood pressure manually in the right arm.  He was 128/72.  Will not make any changes in her medications at this time.  2. Chronic combined systolic and diastolic heart failure.: no evidence of fluid overload or decompensation.  She will continue current medication regimen.  I will check a BMET as one has not been checked in several months.  3. Anemia:we will check a CBC to evaluate status as long as blood was being drawn with copies to primary care physician.  4. Nonischemic cardiomyopathy.stable, asymptomatic.   Current medicines are reviewed at length with the patient today.    Labs/ tests ordered today include: CBC and BMET.  Orders Placed This Encounter  Procedures  . EKG 12-Lead     Disposition:   FU with 6 months.  Signed, Jory Sims, NP  04/07/2015 3:40 PM    [facility] [address], [place], [place] [address] Phone: [phone]; Fax: [phone]

## 2015-04-07 NOTE — Progress Notes (Deleted)
Name: Alicia Davidson    DOB: 1930/11/30  Age: 82 y.o.  MR#: [id number]       PCP:  Alonza Bogus, MD      Insurance: Payor: [employer] MEDICARE / Plan: [employer] MEDICARE CHOICE PPO / Product Type: *No Product type* /   CC:   No chief complaint on file.   VS Filed Vitals:   04/07/15 1521  BP: 98/60  Pulse: 84  Height: '4\' 11"'$  (1.499 m)  Weight: 142 lb (64.411 kg)  SpO2: 97%    Weights Current Weight  04/07/15 142 lb (64.411 kg)  08/25/14 129 lb (58.514 kg)  08/11/14 127 lb 9.6 oz (57.879 kg)    Blood Pressure  BP Readings from Last 3 Encounters:  04/07/15 98/60  08/25/14 124/76  08/11/14 90/54     Admit date:  (Not on file) Last encounter with RMR:  12/20/2014   Allergy Review of patient's allergies indicates no known allergies.  Current Outpatient Prescriptions  Medication Sig Dispense Refill  . omeprazole (PRILOSEC) 20 MG capsule Take 20 mg by mouth daily.    . Ascorbic Acid (VITAMIN C) 1000 MG tablet Take 1,000 mg by mouth 2 (two) times daily.    Marland Kitchen aspirin 81 MG tablet Take 81 mg by mouth daily.    Marland Kitchen atorvastatin (LIPITOR) 20 MG tablet Take 20 mg by mouth at bedtime.    . calcium carbonate (OS-CAL) 600 MG TABS tablet Take 600 mg by mouth daily.    . carvedilol (COREG) 3.125 MG tablet Take 1 tablet (3.125 mg total) by mouth 2 (two) times daily. 180 tablet 3  . Fe Bisgly-Succ-C-Thre-B12-FA (IRON 21/7 PO) Take 65 mg elemental calcium/kg/hr by mouth 2 (two) times daily.    . folic acid (FOLVITE) 500 MCG tablet Take 400 mcg by mouth daily.    . furosemide (LASIX) 20 MG tablet Take 1 tablet (20 mg total) by mouth daily. 30 tablet 6  . ipratropium-albuterol (DUONEB) 0.5-2.5 (3) MG/3ML SOLN Take 3 mLs by nebulization every 6 (six) hours as needed (shortness of breath).     Marland Kitchen KLOR-CON M20 20 MEQ tablet TAKE ONE TABLET BY MOUTH ONCE DAILY 30 tablet 4  . levalbuterol (XOPENEX HFA) 45 MCG/ACT inhaler Inhale 2 puffs into the lungs every 6 (six) hours as needed for shortness of breath.      . losartan (COZAAR) 25 MG tablet TAKE ONE TABLET BY MOUTH EVERY NIGHT 90 tablet 3  . Multiple Vitamins-Minerals (CENTRUM SILVER) tablet Take 1 tablet by mouth daily.     No current facility-administered medications for this visit.    Discontinued Meds:    Medications Discontinued During This Encounter  Medication Reason  . methylPREDNIsolone (MEDROL DOSPACK) 4 MG tablet Error  . ciprofloxacin (CIPRO) 250 MG tablet Error  . furosemide (LASIX) 20 MG tablet Error  . HYDROcodone-acetaminophen (NORCO/VICODIN) 5-325 MG per tablet Error  . omeprazole (PRILOSEC OTC) 20 MG tablet Error    Patient Active Problem List   Diagnosis Date Noted  . Orthostatic hypotension 08/11/2014  . PVD (peripheral vascular disease) ([place]) 08/11/2014  . Nonischemic cardiomyopathy ([place]) 09/01/2013  . Lung cancer ([place])   . Squamous cell carcinoma lung ([place]) 01/26/2013  . Diastolic dysfunction, left ventricle 01/26/2013  . Chronic combined systolic and diastolic heart failure ([place]) 05/11/2012  . Mitral regurgitation 05/11/2012  . Dyspnea 08/27/2011  . COPD GOLD II 08/27/2011  . Smoking 08/27/2011  . Anemia 08/27/2011  . Essential hypertension, benign 08/27/2011    LABS  Component Value Date/Time   NA 138 08/22/2014 1515   NA 137 02/04/2014 1930   NA 141 09/25/2013 1040   K 3.9 08/22/2014 1515   K 3.6 02/04/2014 1930   K 4.1 09/25/2013 1040   CL 101 08/22/2014 1515   CL 102 02/04/2014 1930   CL 100 09/25/2013 1040   CO2 29 08/22/2014 1515   CO2 27 02/04/2014 1930   CO2 28 09/25/2013 1040   GLUCOSE 90 08/22/2014 1515   GLUCOSE 115* 02/04/2014 1930   GLUCOSE 126* 09/25/2013 1040   BUN 20 08/22/2014 1515   BUN 31* 02/04/2014 1930   BUN 16 09/25/2013 1040   CREATININE 1.00 03/17/2015 1657   CREATININE 1.20* 12/12/2014 1638   CREATININE 1.20* 09/26/2014 1600   CREATININE 1.05 07/07/2012 1000   CREATININE 1.11* 05/21/2012 0250   CALCIUM 9.3 08/22/2014 1515   CALCIUM 9.0 02/04/2014 1930    CALCIUM 9.2 09/25/2013 1040   GFRNONAA 52* 08/22/2014 1515   GFRNONAA 45* 02/04/2014 1930   GFRNONAA 54* 09/25/2013 1040   GFRAA 60* 08/22/2014 1515   GFRAA 53* 02/04/2014 1930   GFRAA 63* 09/25/2013 1040   CMP     Component Value Date/Time   NA 138 08/22/2014 1515   K 3.9 08/22/2014 1515   CL 101 08/22/2014 1515   CO2 29 08/22/2014 1515   GLUCOSE 90 08/22/2014 1515   BUN 20 08/22/2014 1515   CREATININE 1.00 03/17/2015 1657   CREATININE 1.05 07/07/2012 1000   CALCIUM 9.3 08/22/2014 1515   PROT 7.1 02/04/2014 1930   ALBUMIN 3.9 02/04/2014 1930   AST 21 02/04/2014 1930   ALT 26 02/04/2014 1930   ALKPHOS 67 02/04/2014 1930   BILITOT 0.5 02/04/2014 1930   GFRNONAA 52* 08/22/2014 1515   GFRAA 60* 08/22/2014 1515       Component Value Date/Time   WBC 6.6 08/22/2014 1515   WBC 8.9 02/04/2014 1930   WBC 8.2 09/25/2013 1040   HGB 11.8* 08/22/2014 1515   HGB 12.4 02/04/2014 1930   HGB 12.0 09/25/2013 1040   HCT 36.6 08/22/2014 1515   HCT 39.4 02/04/2014 1930   HCT 36.6 09/25/2013 1040   MCV 93.4 08/22/2014 1515   MCV 96.6 02/04/2014 1930   MCV 93.8 09/25/2013 1040    Lipid Panel  No results found for: CHOL, TRIG, HDL, CHOLHDL, VLDL, LDLCALC, LDLDIRECT  ABG    Component Value Date/Time   PHART 7.444 03/15/2013 1605   PCO2ART 34.5* 03/15/2013 1605   PO2ART 79.1* 03/15/2013 1605   HCO3 23.3 03/15/2013 1605   TCO2 21.2 03/15/2013 1605   ACIDBASEDEF 0.3 03/15/2013 1605   O2SAT 95.8 03/15/2013 1605     Lab Results  Component Value Date   TSH 0.96 08/27/2011   BNP (last 3 results) No results for input(s): BNP in the last 8760 hours.  ProBNP (last 3 results) No results for input(s): PROBNP in the last 8760 hours.  Cardiac Panel (last 3 results) No results for input(s): CKTOTAL, CKMB, TROPONINI, RELINDX in the last 72 hours.  Iron/TIBC/Ferritin/ %Sat No results found for: IRON, TIBC, FERRITIN, IRONPCTSAT   EKG Orders placed or performed in visit on 04/07/15   . EKG 12-Lead     Prior Assessment and Plan Problem List as of 04/07/2015      Cardiovascular and Mediastinum   Essential hypertension, benign   Last Assessment & Plan 11/26/2013 Office Visit Written 11/26/2013  4:29 PM by Satira Sark, MD    Blood pressure is normal  today. Continue current regimen.      Chronic combined systolic and diastolic heart failure [facility])   Last Assessment & Plan 02/18/2013 Office Visit Written 02/18/2013  1:38 PM by Lendon Colonel, NP    Blood pressure is slightly low today. I rechecked it manually and this correlated with triage recordings. She was NPO for 24 hours yesterday prior to biopsy. She has not been eating or drinking well since. I suspect that she may be a little dehydrated. I will hold her lasix for two days and then resume. If BP stays low, they are to call us.   I will see her again in one month for reassessment of her BP and recommendations for adjustments in antihypertensives if necessary.      Mitral regurgitation   Last Assessment & Plan 06/16/2012 Office Visit Edited 06/16/2012 12:15 PM by Lendon Colonel, NP    Per echocardiogram the patient was found to have moderate mitral regurg, with a moderately dilated left atrium and mildly to markedly dilated right ventricle. Continue heart rate and blood pressure control.      Diastolic dysfunction, left ventricle   Nonischemic cardiomyopathy [facility])   Last Assessment & Plan 08/11/2014 Office Visit Written 08/11/2014  3:25 PM by Erlene Quan, PA-C    EF 40-45% in Oct 2014- improved to 50-55% Aug 2015      Orthostatic hypotension   Last Assessment & Plan 08/11/2014 Office Visit Written 08/11/2014  3:23 PM by Erlene Quan, PA-C    Referred from Radiation Oncology for symptomatic orthostatic B/P      PVD (peripheral vascular disease) [facility])   Last Assessment & Plan 08/11/2014 Office Visit Written 08/11/2014  3:26 PM by Erlene Quan, PA-C    Asymptomatic moderate bilateral internal CA stenosis by  doppler Jan 2015        Respiratory   COPD GOLD II   Last Assessment & Plan 09/01/2013 Office Visit Written 09/01/2013 11:32 AM by Satira Sark, MD    Keep followup with pulmonary.      Squamous cell carcinoma lung [facility])   Last Assessment & Plan 09/01/2013 Office Visit Written 09/01/2013 11:31 AM by Satira Sark, MD    History reviewed above.      Lung cancer [facility])   Last Assessment & Plan 08/11/2014 Office Visit Written 08/11/2014  3:24 PM by Erlene Quan, PA-C    Diagnosed in 2014-currently on her second round of radiation Rx        Other   Dyspnea   Last Assessment & Plan 08/03/2012 Office Visit Written 08/03/2012  4:41 PM by Lendon Colonel, NP    This is related to chronic COPD. There is no wheezes or productive coughing on exam. No changes in her medicines.      Smoking   Last Assessment & Plan 10/14/2011 Office Visit Written 10/14/2011  3:24 PM by Tanda Rockers, MD    I reviewed the Flethcher curve with patient that basically indicates  if you quit smoking when your best day FEV1 is still well preserved (which hers is) it is highly unlikely you will progress to severe disease and informed the patient there was no medication on the market that has proven to change the curve or the likelihood of progression.  Therefore stopping smoking and maintaining abstinence is the most important aspect of care, not choice of inhalers or for that matter, doctors.   Pulmonary f/u can be prn      Anemia  Last Assessment & Plan 08/27/2011 Office Visit Written 08/27/2011  3:22 PM by Tanda Rockers, MD     Lab 08/27/11 1231  HGB 8.6 Repeated and verified X2.*    Normocytic with recent gi w/u neg in [place], should continue Fe for now and f/u with primary          Imaging: Ct Chest W Contrast  03/17/2015  CLINICAL DATA:  Lung cancer diagnosed 2015.  Former smoker. EXAM: CT CHEST WITH CONTRAST TECHNIQUE: Multidetector CT imaging of the chest was performed during intravenous contrast  administration. CONTRAST:  78m OMNIPAQUE IOHEXOL 300 MG/ML  SOLN COMPARISON:  CT 12/12/2014 FINDINGS: Mediastinum/Nodes: No axillary or supraclavicular lymphadenopathy. No mediastinal hilar adenopathy. No pericardial fluid. No central pulmonary embolism. Lungs/Pleura: Small pulmonary nodule in the RIGHT upper lobe measuring 3 mm (image 22, series 3) is not identified on comparison exam. There is atelectasis and scarring in the LEFT lower lobe similar prior. Central airways are normal Upper abdomen: Limited view of the liver, kidneys, pancreas are unremarkable. Normal adrenal glands. Musculoskeletal: No aggressive osseous lesion. Degenerate spurring of the endplates IMPRESSION: 1. Small RIGHT upper lobe pulmonary nodule not identified on prior. Recommend attention on routine oncology surveillance. 2. Emphysematous change. Electronically Signed   By: SSuzy BouchardM.D.   On: 03/17/2015 18:13

## 2015-04-07 NOTE — Patient Instructions (Signed)
Your physician wants you to follow-up in: 6 Month with Dr. Domenic Polite.  You will receive a reminder letter in the mail two months in advance. If you don't receive a letter, please call our office to schedule the follow-up appointment.  Your physician recommends that you continue on your current medications as directed. Please refer to the Current Medication list given to you today.  If you need a refill on your cardiac medications before your next appointment, please call your pharmacy.  Your physician recommends that you have lab work done today.  Thank you for choosing [facility]!

## 2015-04-10 ENCOUNTER — Encounter (HOSPITAL_COMMUNITY): Payer: Self-pay | Admitting: Emergency Medicine

## 2015-04-10 ENCOUNTER — Emergency Department (HOSPITAL_COMMUNITY)
Admission: EM | Admit: 2015-04-10 | Discharge: 2015-04-10 | Disposition: A | Discharge status: Home / Self Care | Payer: Medicare PPO | Attending: Emergency Medicine | Admitting: Emergency Medicine

## 2015-04-10 ENCOUNTER — Emergency Department (HOSPITAL_COMMUNITY): Payer: Medicare PPO

## 2015-04-10 DIAGNOSIS — R0602 Shortness of breath: Secondary | ICD-10-CM | POA: Diagnosis present

## 2015-04-10 DIAGNOSIS — Z7982 Long term (current) use of aspirin: Secondary | ICD-10-CM | POA: Insufficient documentation

## 2015-04-10 DIAGNOSIS — Z87891 Personal history of nicotine dependence: Secondary | ICD-10-CM | POA: Insufficient documentation

## 2015-04-10 DIAGNOSIS — I5022 Chronic systolic (congestive) heart failure: Secondary | ICD-10-CM | POA: Diagnosis not present

## 2015-04-10 DIAGNOSIS — I110 Hypertensive heart disease with heart failure: Secondary | ICD-10-CM | POA: Insufficient documentation

## 2015-04-10 DIAGNOSIS — J441 Chronic obstructive pulmonary disease with (acute) exacerbation: Secondary | ICD-10-CM | POA: Diagnosis not present

## 2015-04-10 DIAGNOSIS — Z79899 Other long term (current) drug therapy: Secondary | ICD-10-CM | POA: Insufficient documentation

## 2015-04-10 DIAGNOSIS — Z85118 Personal history of other malignant neoplasm of bronchus and lung: Secondary | ICD-10-CM | POA: Insufficient documentation

## 2015-04-10 DIAGNOSIS — N390 Urinary tract infection, site not specified: Secondary | ICD-10-CM | POA: Diagnosis not present

## 2015-04-10 LAB — CBC WITH DIFFERENTIAL/PLATELET
BASOS PCT: 0 %
Basophils Absolute: 0 10*3/uL (ref 0.0–0.1)
EOS ABS: 0.2 10*3/uL (ref 0.0–0.7)
Eosinophils Relative: 3 %
HCT: 37.4 % (ref 36.0–46.0)
HEMOGLOBIN: 11.9 g/dL — AB (ref 12.0–15.0)
LYMPHS ABS: 0.9 10*3/uL (ref 0.7–4.0)
Lymphocytes Relative: 16 %
MCH: 29.8 pg (ref 26.0–34.0)
MCHC: 31.8 g/dL (ref 30.0–36.0)
MCV: 93.5 fL (ref 78.0–100.0)
MONO ABS: 0.5 10*3/uL (ref 0.1–1.0)
MONOS PCT: 9 %
NEUTROS PCT: 72 %
Neutro Abs: 4 10*3/uL (ref 1.7–7.7)
Platelets: 278 10*3/uL (ref 150–400)
RBC: 4 MIL/uL (ref 3.87–5.11)
RDW: 14.4 % (ref 11.5–15.5)
WBC: 5.5 10*3/uL (ref 4.0–10.5)

## 2015-04-10 LAB — URINALYSIS, ROUTINE W REFLEX MICROSCOPIC
BILIRUBIN URINE: NEGATIVE
Glucose, UA: NEGATIVE mg/dL
HGB URINE DIPSTICK: NEGATIVE
KETONES UR: NEGATIVE mg/dL
NITRITE: NEGATIVE
PROTEIN: NEGATIVE mg/dL
Specific Gravity, Urine: 1.005 (ref 1.005–1.030)
pH: 7.5 (ref 5.0–8.0)

## 2015-04-10 LAB — COMPREHENSIVE METABOLIC PANEL
ALK PHOS: 85 U/L (ref 38–126)
ALT: 13 U/L — ABNORMAL LOW (ref 14–54)
ANION GAP: 11 (ref 5–15)
AST: 25 U/L (ref 15–41)
Albumin: 3.9 g/dL (ref 3.5–5.0)
BILIRUBIN TOTAL: 0.4 mg/dL (ref 0.3–1.2)
BUN: 15 mg/dL (ref 6–20)
CALCIUM: 9.1 mg/dL (ref 8.9–10.3)
CO2: 28 mmol/L (ref 22–32)
Chloride: 102 mmol/L (ref 101–111)
Creatinine, Ser: 0.88 mg/dL (ref 0.44–1.00)
GFR calc non Af Amer: 60 mL/min — ABNORMAL LOW (ref >60)
Glucose, Bld: 100 mg/dL — ABNORMAL HIGH (ref 65–99)
POTASSIUM: 3.7 mmol/L (ref 3.5–5.1)
SODIUM: 141 mmol/L (ref 135–145)
TOTAL PROTEIN: 7.6 g/dL (ref 6.5–8.1)

## 2015-04-10 LAB — TROPONIN I
TROPONIN I: 0.03 ng/mL (ref <0.031)
Troponin I: <0.03 ng/mL (ref <0.031)

## 2015-04-10 LAB — BRAIN NATRIURETIC PEPTIDE: B Natriuretic Peptide: 172 pg/mL — ABNORMAL HIGH (ref 0.0–100.0)

## 2015-04-10 LAB — URINE MICROSCOPIC-ADD ON: RBC / HPF: NONE SEEN RBC/hpf (ref 0–5)

## 2015-04-10 MED ORDER — IPRATROPIUM-ALBUTEROL 0.5-2.5 (3) MG/3ML IN SOLN
3.0000 mL | Freq: Once | RESPIRATORY_TRACT | Status: AC
  Administered 2015-04-10: 3 mL via RESPIRATORY_TRACT
  Filled 2015-04-10: qty 3

## 2015-04-10 MED ORDER — METHYLPREDNISOLONE SODIUM SUCC 125 MG IJ SOLR
80.0000 mg | Freq: Once | INTRAMUSCULAR | Status: AC
  Administered 2015-04-10: 80 mg via INTRAVENOUS
  Filled 2015-04-10: qty 2

## 2015-04-10 MED ORDER — LEVOFLOXACIN 250 MG PO TABS
250.0000 mg | ORAL_TABLET | Freq: Every day | ORAL | Status: AC
Start: 2015-04-10 — End: ?

## 2015-04-10 MED ORDER — IOHEXOL 350 MG/ML SOLN
100.0000 mL | Freq: Once | INTRAVENOUS | Status: AC | PRN
  Administered 2015-04-10: 100 mL via INTRAVENOUS

## 2015-04-10 MED ORDER — PREDNISONE 50 MG PO TABS: ORAL_TABLET | ORAL | Status: AC

## 2015-04-10 MED ORDER — SODIUM CHLORIDE 0.9 % IV BOLUS (SEPSIS): 500.0000 mL | Freq: Once | INTRAVENOUS | Status: DC

## 2015-04-10 MED ORDER — DEXTROSE 5 % IV SOLN
1.0000 g | Freq: Once | INTRAVENOUS | Status: AC
  Administered 2015-04-10: 1 g via INTRAVENOUS
  Filled 2015-04-10: qty 10

## 2015-04-10 NOTE — ED Notes (Signed)
PT maintained at or above 92% with ambulation with NAD noted.

## 2015-04-10 NOTE — Discharge Instructions (Signed)
Chronic Obstructive Pulmonary Disease Follow-up with Dr. Luan Pulling this week. Take the antibiotics and steroids as prescribed. Return to the ED if you develop chest pain, shortness of breath or any other concerns. Chronic obstructive pulmonary disease (COPD) is a common lung condition in which airflow from the lungs is limited. COPD is a general term that can be used to describe many different lung problems that limit airflow, including both chronic bronchitis and emphysema. If you have COPD, your lung function will probably never return to normal, but there are measures you can take to improve lung function and make yourself feel better. CAUSES   Smoking (common).  Exposure to secondhand smoke.  Genetic problems.  Chronic inflammatory lung diseases or recurrent infections. SYMPTOMS  Shortness of breath, especially with physical activity.  Deep, persistent (chronic) cough with a large amount of thick mucus.  Wheezing.  Rapid breaths (tachypnea).  Gray or bluish discoloration (cyanosis) of the skin, especially in your fingers, toes, or lips.  Fatigue.  Weight loss.  Frequent infections or episodes when breathing symptoms become much worse (exacerbations).  Chest tightness. DIAGNOSIS Your health care provider will take a medical history and perform a physical examination to diagnose COPD. Additional tests for COPD may include:  Lung (pulmonary) function tests.  Chest X-ray.  CT scan.  Blood tests. TREATMENT  Treatment for COPD may include:  Inhaler and nebulizer medicines. These help manage the symptoms of COPD and make your breathing more comfortable.  Supplemental oxygen. Supplemental oxygen is only helpful if you have a low oxygen level in your blood.  Exercise and physical activity. These are beneficial for nearly all people with COPD.  Lung surgery or transplant.  Nutrition therapy to gain weight, if you are underweight.  Pulmonary rehabilitation. This may  involve working with a team of health care providers and specialists, such as respiratory, occupational, and physical therapists. HOME CARE INSTRUCTIONS  Take all medicines (inhaled or pills) as directed by your health care provider.  Avoid over-the-counter medicines or cough syrups that dry up your airway (such as antihistamines) and slow down the elimination of secretions unless instructed otherwise by your health care provider.  If you are a smoker, the most important thing that you can do is stop smoking. Continuing to smoke will cause further lung damage and breathing trouble. Ask your health care provider for help with quitting smoking. He or she can direct you to community resources or hospitals that provide support.  Avoid exposure to irritants such as smoke, chemicals, and fumes that aggravate your breathing.  Use oxygen therapy and pulmonary rehabilitation if directed by your health care provider. If you require home oxygen therapy, ask your health care provider whether you should purchase a pulse oximeter to measure your oxygen level at home.  Avoid contact with individuals who have a contagious illness.  Avoid extreme temperature and humidity changes.  Eat healthy foods. Eating smaller, more frequent meals and resting before meals may help you maintain your strength.  Stay active, but balance activity with periods of rest. Exercise and physical activity will help you maintain your ability to do things you want to do.  Preventing infection and hospitalization is very important when you have COPD. Make sure to receive all the vaccines your health care provider recommends, especially the pneumococcal and influenza vaccines. Ask your health care provider whether you need a pneumonia vaccine.  Learn and use relaxation techniques to manage stress.  Learn and use controlled breathing techniques as directed by your health  care provider. Controlled breathing techniques include:  Pursed  lip breathing. Start by breathing in (inhaling) through your nose for 1 second. Then, purse your lips as if you were going to whistle and breathe out (exhale) through the pursed lips for 2 seconds.  Diaphragmatic breathing. Start by putting one hand on your abdomen just above your waist. Inhale slowly through your nose. The hand on your abdomen should move out. Then purse your lips and exhale slowly. You should be able to feel the hand on your abdomen moving in as you exhale.  Learn and use controlled coughing to clear mucus from your lungs. Controlled coughing is a series of short, progressive coughs. The steps of controlled coughing are: 1. Lean your head slightly forward. 2. Breathe in deeply using diaphragmatic breathing. 3. Try to hold your breath for 3 seconds. 4. Keep your mouth slightly open while coughing twice. 5. Spit any mucus out into a tissue. 6. Rest and repeat the steps once or twice as needed. SEEK MEDICAL CARE IF:  You are coughing up more mucus than usual.  There is a change in the color or thickness of your mucus.  Your breathing is more labored than usual.  Your breathing is faster than usual. SEEK IMMEDIATE MEDICAL CARE IF:  You have shortness of breath while you are resting.  You have shortness of breath that prevents you from:  Being able to talk.  Performing your usual physical activities.  You have chest pain lasting longer than 5 minutes.  Your skin color is more cyanotic than usual.  You measure low oxygen saturations for longer than 5 minutes with a pulse oximeter. MAKE SURE YOU:  Understand these instructions.  Will watch your condition.  Will get help right away if you are not doing well or get worse.   This information is not intended to replace advice given to you by your health care provider. Make sure you discuss any questions you have with your health care provider.   Document Released: 10/03/2004 Document Revised: 01/14/2014 Document  Reviewed: 08/20/2012 Elsevier Interactive Patient Education [employer].

## 2015-04-10 NOTE — ED Notes (Signed)
MD at bedside. 

## 2015-04-10 NOTE — ED Provider Notes (Signed)
CSN: [id number]     Arrival date & time 04/10/15  0844 History   First MD Initiated Contact with Patient 04/10/15 [phone]     Chief Complaint  Patient presents with  . Shortness of Breath     (Consider location/radiation/quality/duration/timing/severity/associated sxs/prior Treatment) HPI Comments: Patient with history of COPD presenting with shortness of breath onset yesterday morning around 3 AM. Breathing is worse with exertion and any kind of movement. States this normally improves with her albuterol which she only uses as necessary but there is no improvement at this time. She denies fever, chills, nausea or vomiting. There is no cough. There is no chest pain. There is no leg pain or leg swelling. No abdominal pain, nausea or vomiting. No diarrhea. Patient is a previous smoker. She has remote history of lung cancer. No history of blood clots. Denies any cardiac history but record review shows history of nonischemic cardiomyopathy. She takes Lasix daily. She was seen in the cardiologist's office a few days ago and was told that she was doing well.   The history is provided by the patient and a relative.    Past Medical History  Diagnosis Date  . COPD (chronic obstructive pulmonary disease) ([place])   . Essential hypertension, benign   . Hypercholesteremia   . Osteoporosis   . B12 deficiency   . Anemia associated with acute blood loss   . Gastric ulcer with hemorrhage but without obstruction   . Weight loss, non-intentional   . Anxiety   . Umbilical hernia   . Gastritis     Mild  . Chronic systolic heart failure [facility])     Diagnosed [place] March 2014  . Cancer ([place])   . Cardiomyopathy ([place])     Probable nonischemic, LVEF 40-45%  . Lung cancer ([place]) 02/17/13    left lower lobe  . Hx of radiation therapy 03/29/13- 04/05/13    SBRT- LLL lung 5400 cGy in 3 sessions  . Dyspnea     chronic   Past Surgical History  Procedure Laterality Date  . Lung biopsy Left 02/17/13    LLL  . Tubal  ligation     Family History  Problem Relation Age of Onset  . Heart disease Father   . Colon cancer Sister   . Kidney failure Brother   . Other Mother     Bright's disease   Social History  Substance Use Topics  . Smoking status: Former Smoker -- 0.30 packs/day for 56 years    Types: Cigarettes    Quit date: 01/07/2012  . Smokeless tobacco: Never Used  . Alcohol Use: No   OB History    No data available     Review of Systems  Constitutional: Negative for fever, activity change and appetite change.  HENT: Negative for congestion and rhinorrhea.   Respiratory: Positive for shortness of breath. Negative for cough and chest tightness.   Cardiovascular: Negative for chest pain and leg swelling.  Gastrointestinal: Negative for nausea, vomiting and abdominal pain.  Genitourinary: Negative for dysuria, hematuria, vaginal bleeding and vaginal discharge.  Musculoskeletal: Negative for myalgias and arthralgias.  Skin: Negative for rash.  Neurological: Negative for dizziness, weakness, light-headedness and headaches.  A complete 10 system review of systems was obtained and all systems are negative except as noted in the HPI and PMH.      Allergies  Review of patient's allergies indicates no known allergies.  Home Medications   Prior to Admission medications   Medication Sig Start Date  End Date Taking? Authorizing Provider  Ascorbic Acid (VITAMIN C) 1000 MG tablet Take 1,000 mg by mouth 2 (two) times daily.   Yes Historical Provider, MD  aspirin 81 MG tablet Take 81 mg by mouth daily.   Yes Historical Provider, MD  atorvastatin (LIPITOR) 20 MG tablet Take 20 mg by mouth at bedtime.   Yes Historical Provider, MD  calcium carbonate (OS-CAL) 600 MG TABS tablet Take 600 mg by mouth daily.   Yes Historical Provider, MD  carvedilol (COREG) 3.125 MG tablet Take 1 tablet (3.125 mg total) by mouth 2 (two) times daily. 08/11/14  Yes Luke K Kilroy, PA-C  Fe Bisgly-Succ-C-Thre-B12-FA (IRON 21/7  PO) Take 65 mg elemental calcium/kg/hr by mouth 2 (two) times daily.   Yes Historical Provider, MD  folic acid (FOLVITE) 737 MCG tablet Take 400 mcg by mouth daily.   Yes Historical Provider, MD  furosemide (LASIX) 20 MG tablet Take 1 tablet (20 mg total) by mouth daily. 08/11/14  Yes Luke K Kilroy, PA-C  ipratropium-albuterol (DUONEB) 0.5-2.5 (3) MG/3ML SOLN Take 3 mLs by nebulization every 6 (six) hours as needed (shortness of breath).    Yes Historical Provider, MD  KLOR-CON M20 20 MEQ tablet TAKE ONE TABLET BY MOUTH ONCE DAILY 12/14/14  Yes Doreene Burke Kilroy, PA-C  levalbuterol [facility] HFA) 45 MCG/ACT inhaler Inhale 2 puffs into the lungs every 6 (six) hours as needed for shortness of breath.    Yes Historical Provider, MD  losartan (COZAAR) 25 MG tablet TAKE ONE TABLET BY MOUTH EVERY NIGHT 08/11/14  Yes Erlene Quan, PA-C  Multiple Vitamins-Minerals (CENTRUM SILVER) tablet Take 1 tablet by mouth daily.   Yes Historical Provider, MD  omeprazole (PRILOSEC) 20 MG capsule Take 20 mg by mouth daily.   Yes Historical Provider, MD  levofloxacin (LEVAQUIN) 250 MG tablet Take 1 tablet (250 mg total) by mouth daily. 04/10/15   Ezequiel Essex, MD  predniSONE (DELTASONE) 50 MG tablet 1 tablet PO daily 04/10/15   Ezequiel Essex, MD   BP 154/72 mmHg  Pulse 73  Temp(Src) 97.6 F (36.4 C) (Oral)  Resp 18  Ht '4\' 11"'$  (1.499 m)  Wt 140 lb (63.504 kg)  BMI 28.26 kg/m2  SpO2 95% Physical Exam  Constitutional: She is oriented to person, place, and time. She appears well-developed and well-nourished.  HENT:  Head: Normocephalic and atraumatic.  Mouth/Throat: Oropharynx is clear and moist. No oropharyngeal exudate.  Eyes: Conjunctivae and EOM are normal. Pupils are equal, round, and reactive to light.  Neck: Normal range of motion. Neck supple.  No meningismus.  Cardiovascular: Normal rate, regular rhythm, normal heart sounds and intact distal pulses.   No murmur heard. Pulmonary/Chest: Effort normal and breath  sounds normal. No respiratory distress. She has no wheezes.  Diminished breath sounds no wheezing appreciated  Abdominal: Soft. There is no tenderness. There is no rebound and no guarding.  Musculoskeletal: Normal range of motion. She exhibits no edema or tenderness.  No lower extremity edema  Neurological: She is alert and oriented to person, place, and time. No cranial nerve deficit. She exhibits normal muscle tone. Coordination normal.  No ataxia on finger to nose bilaterally. No pronator drift. 5/5 strength throughout. CN 2-12 intact.Equal grip strength. Sensation intact.   Skin: Skin is warm.  Psychiatric: She has a normal mood and affect. Her behavior is normal.  Nursing note and vitals reviewed.   ED Course  Procedures (including critical care time) Labs Review Labs Reviewed  CBC WITH DIFFERENTIAL/PLATELET -  Abnormal; Notable for the following:    Hemoglobin 11.9 (*)    All other components within normal limits  COMPREHENSIVE METABOLIC PANEL - Abnormal; Notable for the following:    Glucose, Bld 100 (*)    ALT 13 (*)    GFR calc non Af Amer 60 (*)    All other components within normal limits  BRAIN NATRIURETIC PEPTIDE - Abnormal; Notable for the following:    B Natriuretic Peptide 172.0 (*)    All other components within normal limits  URINALYSIS, ROUTINE W REFLEX MICROSCOPIC (NOT AT [facility]) - Abnormal; Notable for the following:    APPearance CLOUDY (*)    Leukocytes, UA MODERATE (*)    All other components within normal limits  URINE MICROSCOPIC-ADD ON - Abnormal; Notable for the following:    Squamous Epithelial / LPF TOO NUMEROUS TO COUNT (*)    Bacteria, UA MANY (*)    All other components within normal limits  URINE CULTURE  TROPONIN I  TROPONIN I    Imaging Review Dg Chest 2 View  04/10/2015  CLINICAL DATA:  82 year old hypertensive female with COPD and history of lung cancer (02/18/2015). Shortness of breath. Initial encounter. EXAM: CHEST  2 VIEW COMPARISON:   03/17/2015 chest CT.  06/14/2014 chest x-ray. FINDINGS: No segmental consolidation or pneumothorax. Mild central pulmonary vascular prominence without pulmonary edema. Chronic lung changes. CT detected right upper lobe tiny nodule not appreciated by plain film exam. Please see prior CT report. Cardiomegaly. Calcified mildly tortuous aorta. Mild thoracic kyphosis and degenerative changes. IMPRESSION: No segmental consolidation or pneumothorax. Mild central pulmonary vascular prominence without pulmonary edema. Chronic lung changes. CT detected right upper lobe tiny nodule not appreciated by plain film exam. Please see prior CT report. Cardiomegaly. Calcified mildly tortuous aorta. Electronically Signed   By: Genia Del M.D.   On: 04/10/2015 10:30   Ct Angio Chest Pe W/cm &/or Wo Cm  04/10/2015  CLINICAL DATA:  Shortness of breath starting yesterday morning, history of lung cancer EXAM: CT ANGIOGRAPHY CHEST WITH CONTRAST TECHNIQUE: Multidetector CT imaging of the chest was performed using the standard protocol during bolus administration of intravenous contrast. Multiplanar CT image reconstructions and MIPs were obtained to evaluate the vascular anatomy. CONTRAST:  110m OMNIPAQUE IOHEXOL 350 MG/ML SOLN COMPARISON:  03/17/2015 FINDINGS: Mediastinum/Lymph Nodes: Images of the thoracic inlet are unremarkable. Atherosclerotic calcifications of thoracic aorta and coronary arteries. No aortic aneurysm. Borderline cardiomegaly again noted. No pericardial effusion. There is no mediastinal hematoma or adenopathy. No hilar adenopathy. The study is of excellent technical quality. No pulmonary embolus is noted. Central airways are patent. Lungs/Pleura: Stable chronic scarring and atelectasis in left lower lobe. Mild emphysematous changes again noted. No infiltrate or pulmonary edema. No pleural effusion. Stable 3 mm nodule in right upper lobe. No new pulmonary nodules are noted. Upper abdomen: The visualized liver is  unremarkable. No adrenal gland mass is noted. Tiny hiatal hernia. Atherosclerotic calcifications of abdominal aorta. Musculoskeletal: Sagittal images of the spine shows diffuse osteopenia. Degenerative changes thoracic spine. Sagittal view of the sternum is unremarkable. No destructive bony lesions are noted. Review of the MIP images confirms the above findings. IMPRESSION: 1. No pulmonary embolus is noted. 2. Cardiomegaly again noted. 3. No acute infiltrate or pulmonary edema. Emphysematous changes again noted. Stable 3 mm nodule in right upper lobe. Attention should be given on follow-up routine oncologic surveillance. No new pulmonary nodules. 4. Stable chronic atelectasis and scarring in left lower lobe. Electronically Signed   By:  Lahoma Crocker M.D.   On: 04/10/2015 12:16   I have personally reviewed and evaluated these images and lab results as part of my medical decision-making.   EKG Interpretation   Date/Time:  Monday April 10 2015 08:59:45 EDT Ventricular Rate:  81 PR Interval:  251 QRS Duration: 127 QT Interval:  401 QTC Calculation: 465 R Axis:   54 Text Interpretation:  Sinus arrhythmia Ventricular premature complex  Prolonged PR interval Nonspecific intraventricular conduction delay Repol  abnrm suggests ischemia, anterolateral No significant change was found  Confirmed by Wyvonnia Dusky  MD, [place] ([id number]) on 04/10/2015 9:15:34 AM      MDM   Final diagnoses:  COPD exacerbation (HCC)  UTI (lower urinary tract infection)   Shortness of breath since yesterday. She is not hypoxic and wheezing. She is in no distress. Lungs are relatively clear. EKG with stable T wave inversion and ST depression laterally.  Nebulizer given. CXR stable without infiltrate or PTX.  Some wheezing after neb, better air movement.  Steroids given. CT obtained with history of lung CA and minimal wheezing.  No PE. No acute changes.  Troponin is negative 2. Patient is able to ambulate without desaturation and  maintains oxygenation 92%. She feels much better. She is moving good air. She did have some transient low blood pressures in the ED but these appear to be due to an ill fitting blood pressure cuff. Her blood pressure is now 138/74 manually. She denies any symptoms and is anxious to go home.  Follow-up with Dr. Luan Pulling this week. Treat for COPD exacerbation as well as UTI. Return precautions discussed.  BP 154/72 mmHg  Pulse 73  Temp(Src) 97.6 F (36.4 C) (Oral)  Resp 18  Ht '4\' 11"'$  (1.499 m)  Wt 140 lb (63.504 kg)  BMI 28.26 kg/m2  SpO2 95%   Ezequiel Essex, MD 04/10/15 1749

## 2015-04-10 NOTE — ED Notes (Signed)
Pt c/o sob since yesterday. Denies cp. Pt speaking in complete sentences.

## 2015-04-11 LAB — URINE CULTURE

## 2015-06-07 ENCOUNTER — Other Ambulatory Visit: Payer: Self-pay | Admitting: Adult Health

## 2015-06-14 ENCOUNTER — Other Ambulatory Visit: Payer: Self-pay | Admitting: Cardiology

## 2015-09-27 ENCOUNTER — Telehealth: Payer: Self-pay | Admitting: Cardiology

## 2015-09-27 NOTE — Telephone Encounter (Signed)
Pt's daughter Nicholaus Corolla [phone] called and wanted to let Dr. Domenic Polite know the pt is in [facility] tele [phone], Ms Kercher will be having a TEE done today. Her Dr. There is Dr. Hendricks Milo office# [phone] fax # [phone].

## 2015-09-27 NOTE — Telephone Encounter (Signed)
Sent to Dr. Domenic Polite as an FYI:

## 2015-10-01 ENCOUNTER — Other Ambulatory Visit: Payer: Self-pay | Admitting: Cardiology

## 2015-10-09 ENCOUNTER — Ambulatory Visit (HOSPITAL_COMMUNITY): Payer: Medicare PPO

## 2016-01-22 ENCOUNTER — Ambulatory Visit (HOSPITAL_COMMUNITY): Payer: Medicare PPO

## 2016-01-23 ENCOUNTER — Ambulatory Visit (HOSPITAL_COMMUNITY): Payer: Medicare PPO

## 2016-11-07 DEATH — deceased

## 2016-12-02 ENCOUNTER — Other Ambulatory Visit: Payer: Self-pay | Admitting: *Deleted

## 2016-12-02 NOTE — Patient Outreach (Signed)
[facility]) Care Management  12/02/2016  Alicia Davidson [birth year] [phone]   RN Health Coach telephone call to patient.  Per family member Mrs Provencal has passed away. Per Iver Nestle patient passed away 11-24-2016.  Case closed patient deceased  [place] Management [phone]

## 2017-03-11 IMAGING — CT CT ANGIO CHEST
1 of 6 series · 5 of 36 positions shown · IV contrast (Omnipaque 300)
Comparison: 03/17/2015

CLINICAL DATA: Shortness of breath starting yesterday morning,
history of lung cancer

EXAM:
CT ANGIOGRAPHY CHEST WITH CONTRAST
TECHNIQUE: Multidetector CT imaging of the chest was performed using the
standard protocol during bolus administration of intravenous
contrast. Multiplanar CT image reconstructions and MIPs were
obtained to evaluate the vascular anatomy.
CONTRAST:  100mL OMNIPAQUE IOHEXOL 350 MG/ML SOLN

[Series 4: pe 3.0 b40f · axial · 0.59mm/px · z∈[-234,-78]mm · 5 of 78 slices shown]
[im 13/78  lung]
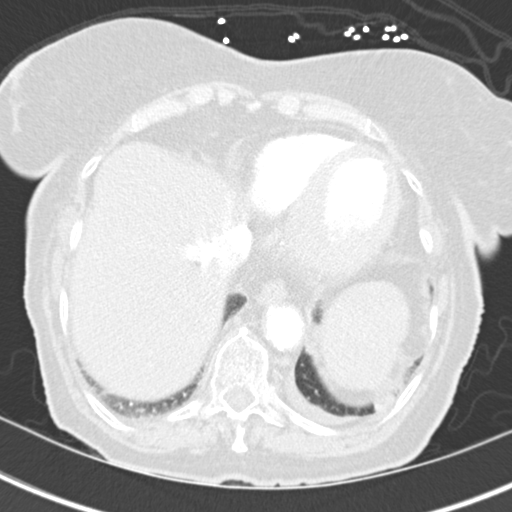
[im 26/78  mediastinal]
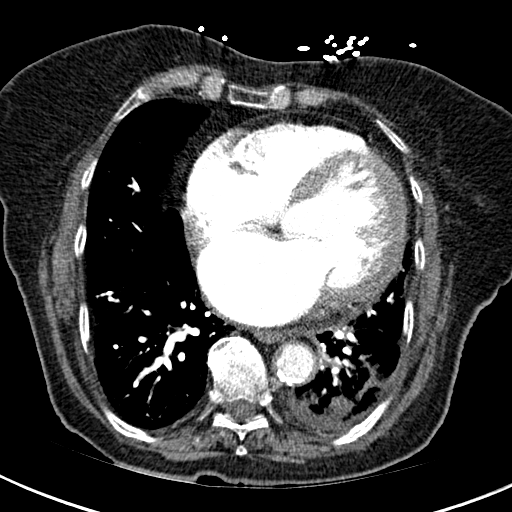
[im 39/78  lung]
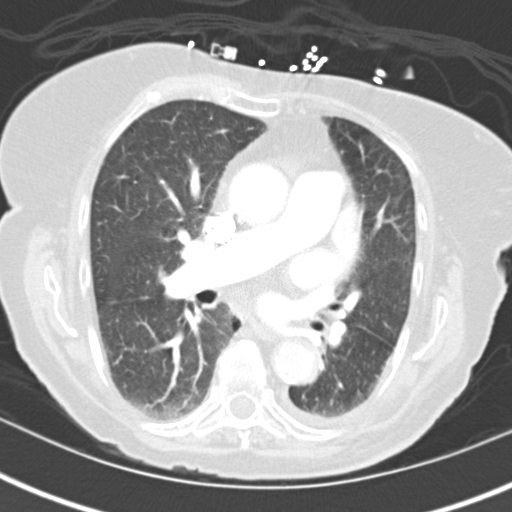
[im 52/78  mediastinal]
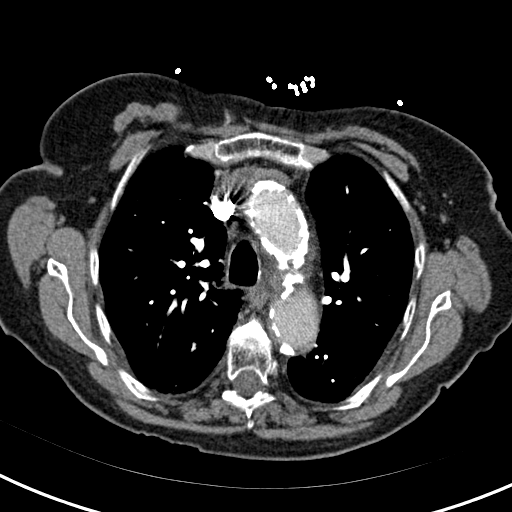
[im 65/78  lung]
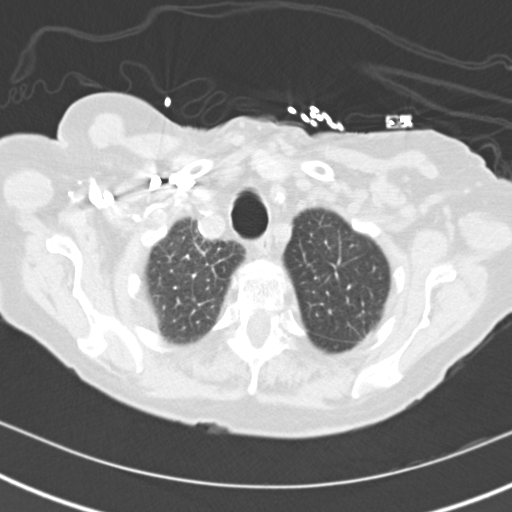

[5 of 36 positions shown; findings below may reference images not displayed]

FINDINGS: Mediastinum/Lymph Nodes: Images of the thoracic inlet are
unremarkable. Atherosclerotic calcifications of thoracic aorta and
coronary arteries. No aortic aneurysm. Borderline cardiomegaly again
noted. No pericardial effusion. There is no mediastinal hematoma or
adenopathy. No hilar adenopathy.

The study is of excellent technical quality. No pulmonary embolus is
noted.

Central airways are patent.

Lungs/Pleura: Stable chronic scarring and atelectasis in left lower
lobe. Mild emphysematous changes again noted. No infiltrate or
pulmonary edema. No pleural effusion. Stable 3 mm nodule in right
upper lobe. No new pulmonary nodules are noted.

Upper abdomen: The visualized liver is unremarkable. No adrenal
gland mass is noted. Tiny hiatal hernia. Atherosclerotic
calcifications of abdominal aorta.

Musculoskeletal: Sagittal images of the spine shows diffuse
osteopenia. Degenerative changes thoracic spine. Sagittal view of
the sternum is unremarkable. No destructive bony lesions are noted.

Review of the MIP images confirms the above findings.
IMPRESSION: 1. No pulmonary embolus is noted.
2. Cardiomegaly again noted.
3. No acute infiltrate or pulmonary edema. Emphysematous changes
again noted. Stable 3 mm nodule in right upper lobe. Attention
should be given on follow-up routine oncologic surveillance. No new
pulmonary nodules.
4. Stable chronic atelectasis and scarring in left lower lobe.

## 2017-03-11 IMAGING — DX DG CHEST 2V
2 series · 2 of 2 positions shown · non-contrast
Comparison: 03/17/2015 chest CT.  06/14/2014 chest x-ray.

CLINICAL DATA: 84-year-old hypertensive female with COPD and
history of lung cancer (02/18/2015). Shortness of breath. Initial
encounter.

EXAM:
CHEST  2 VIEW

[chest pa]
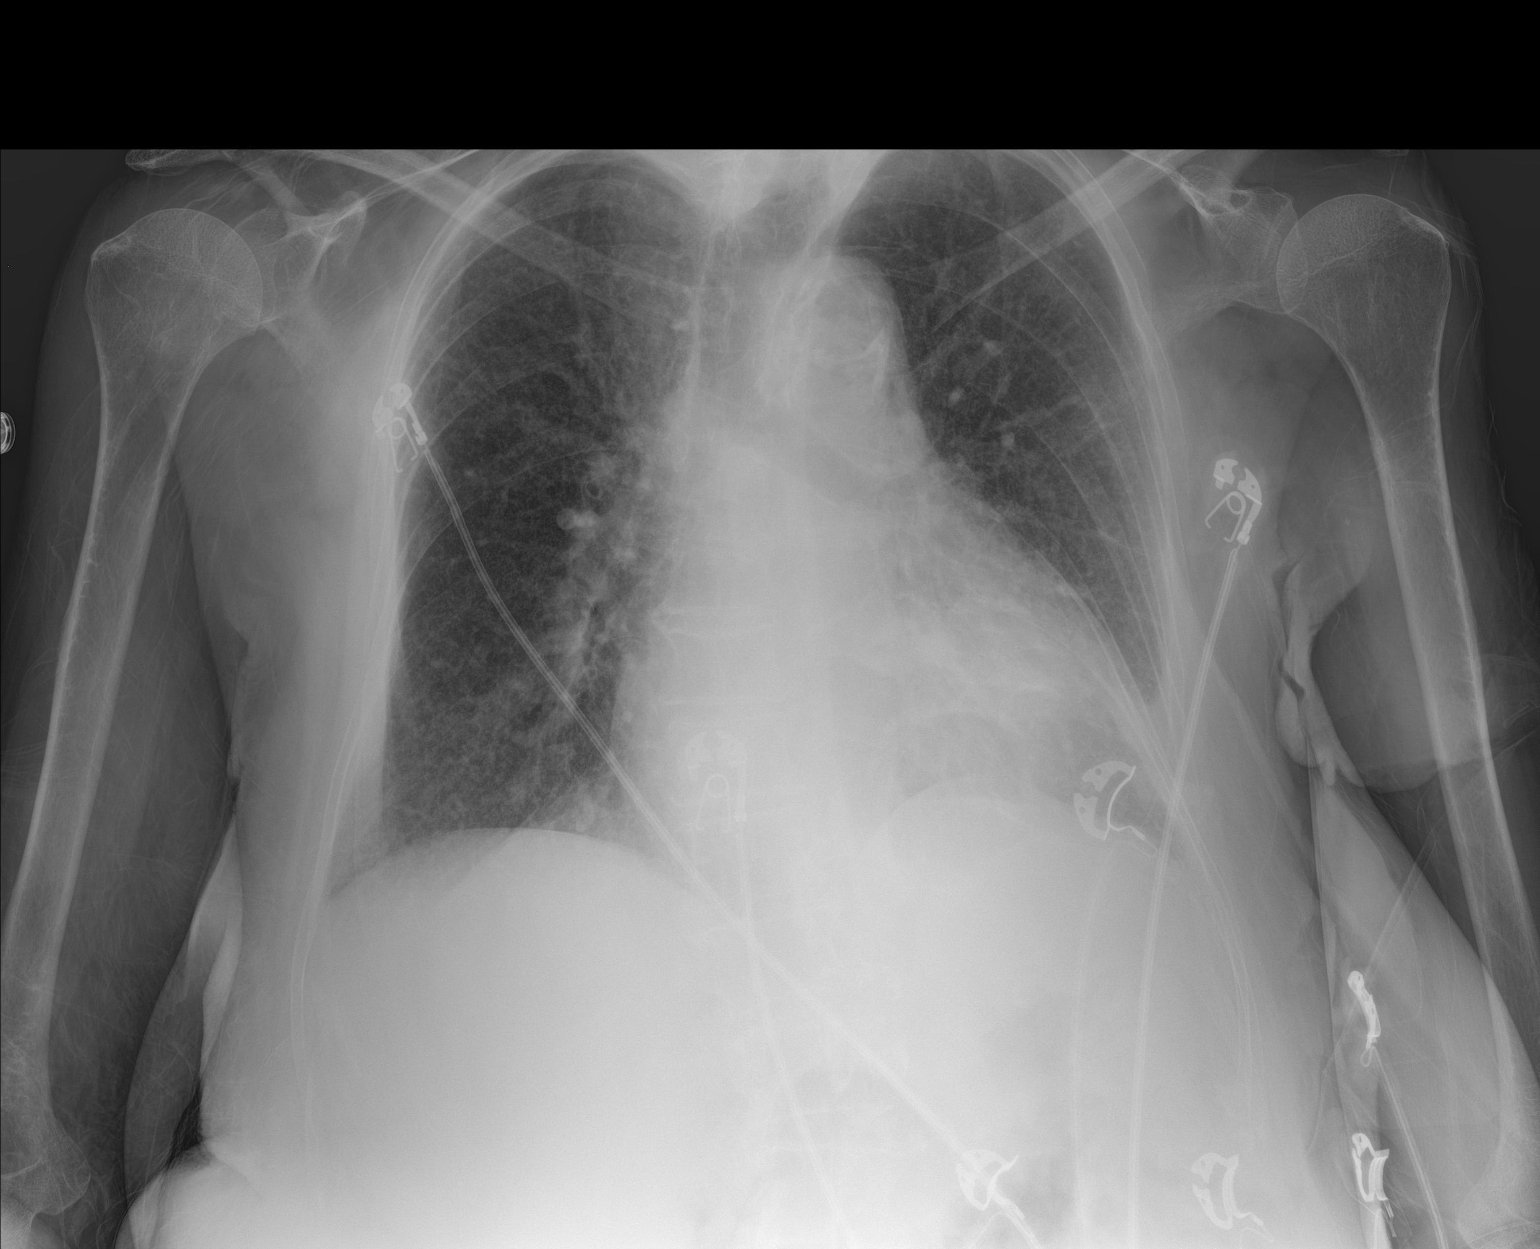

[chest lat]
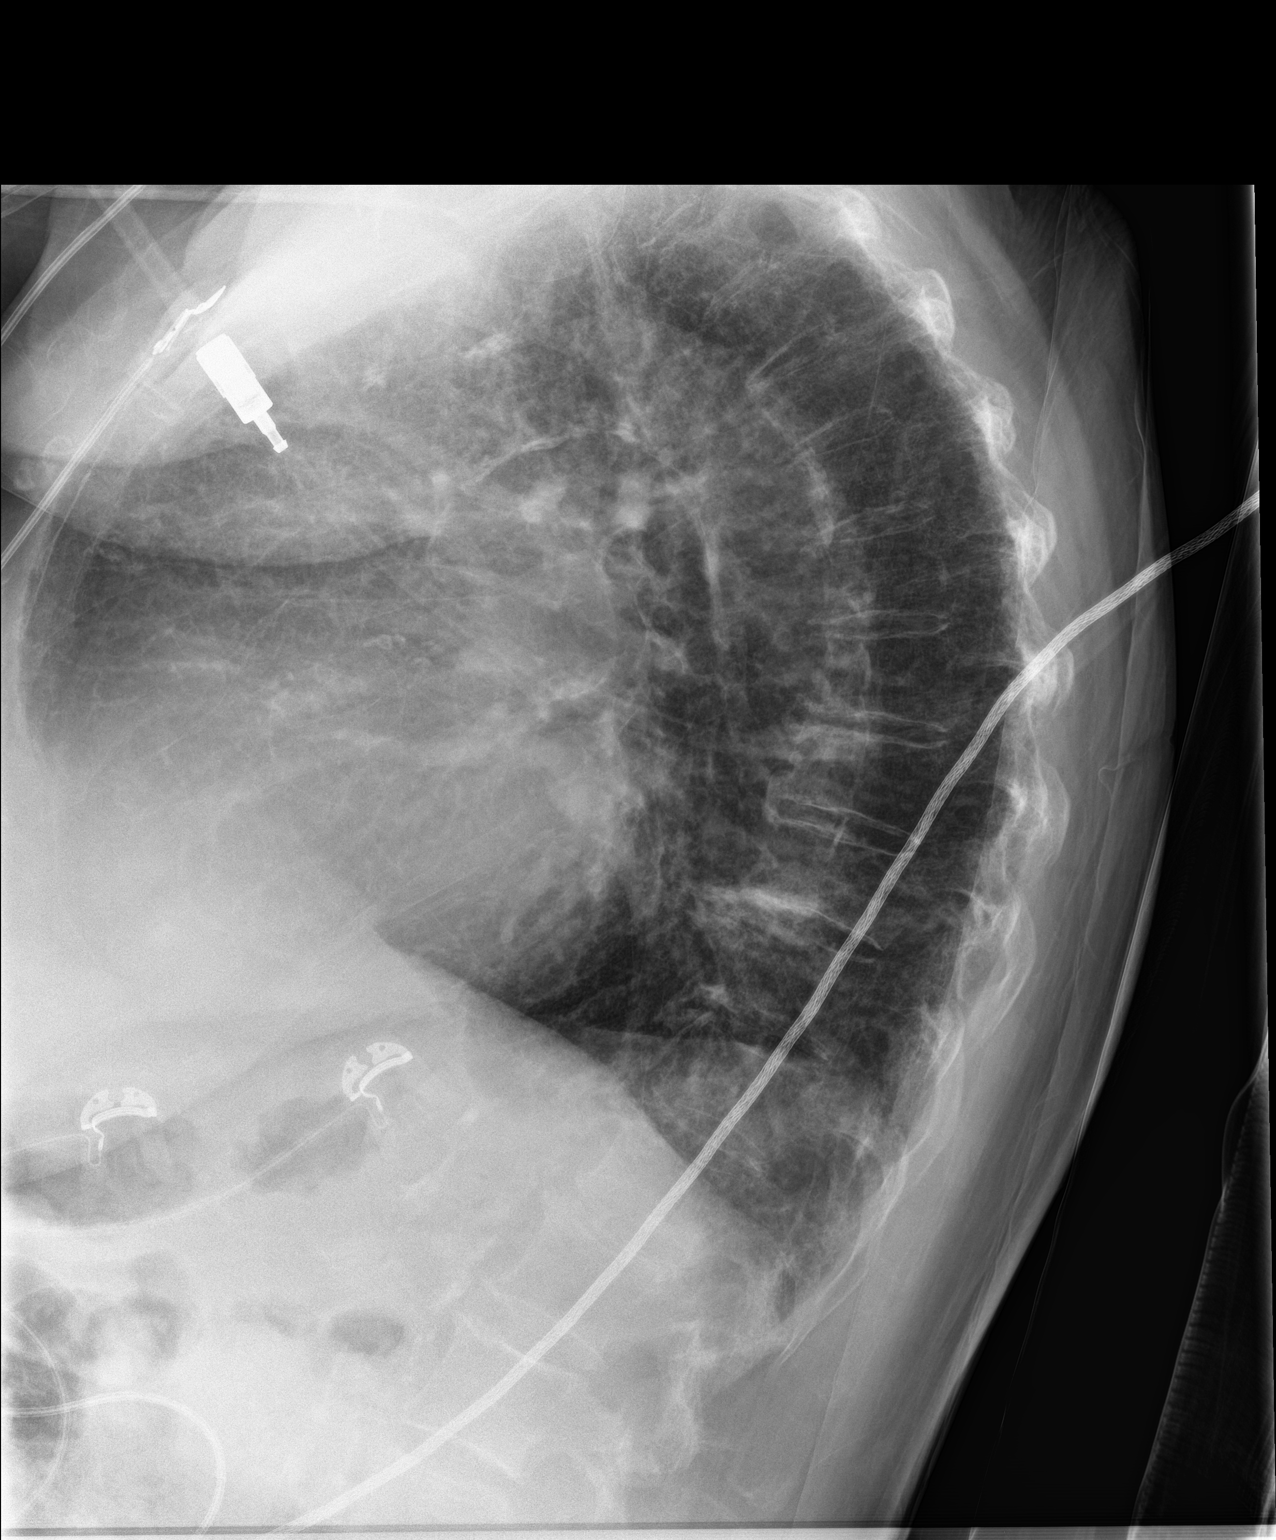

[2 of 2 positions shown; findings below may reference images not displayed]

FINDINGS: No segmental consolidation or pneumothorax.

Mild central pulmonary vascular prominence without pulmonary edema.

Chronic lung changes. CT detected right upper lobe tiny nodule not
appreciated by plain film exam. Please see prior CT report.

Cardiomegaly.

Calcified mildly tortuous aorta.

Mild thoracic kyphosis and degenerative changes.
IMPRESSION: No segmental consolidation or pneumothorax.

Mild central pulmonary vascular prominence without pulmonary edema.

Chronic lung changes. CT detected right upper lobe tiny nodule not
appreciated by plain film exam. Please see prior CT report.

Cardiomegaly.

Calcified mildly tortuous aorta.
